# Patient Record
Sex: Female | Born: 1950
Health system: Southern US, Community
[De-identification: ages and names within clinical notes are randomized; demographics above are authoritative.]

## PROBLEM LIST (undated history)

## (undated) DIAGNOSIS — E785 Hyperlipidemia, unspecified: Secondary | ICD-10-CM

## (undated) DIAGNOSIS — C801 Malignant (primary) neoplasm, unspecified: Secondary | ICD-10-CM

## (undated) DIAGNOSIS — J039 Acute tonsillitis, unspecified: Secondary | ICD-10-CM

## (undated) DIAGNOSIS — I8390 Asymptomatic varicose veins of unspecified lower extremity: Secondary | ICD-10-CM

## (undated) DIAGNOSIS — I10 Essential (primary) hypertension: Secondary | ICD-10-CM

## (undated) HISTORY — DX: Essential (primary) hypertension: I10

## (undated) HISTORY — DX: Asymptomatic varicose veins of unspecified lower extremity: I83.90

## (undated) HISTORY — PX: LAPAROSCOPY: SHX197

## (undated) HISTORY — DX: Acute tonsillitis, unspecified: J03.90

## (undated) HISTORY — DX: Hyperlipidemia, unspecified: E78.5

## (undated) HISTORY — PX: CERVICAL CONIZATION W/BX: SHX1330

## (undated) HISTORY — PX: ABDOMINAL HYSTERECTOMY: SHX81

---

## 1998-12-12 ENCOUNTER — Other Ambulatory Visit: Admission: RE | Admit: 1998-12-12 | Discharge: 1998-12-12 | Payer: Self-pay | Admitting: Obstetrics and Gynecology

## 1999-02-08 ENCOUNTER — Encounter (INDEPENDENT_AMBULATORY_CARE_PROVIDER_SITE_OTHER): Payer: Self-pay | Admitting: Specialist

## 1999-02-08 ENCOUNTER — Inpatient Hospital Stay (HOSPITAL_COMMUNITY): Admission: RE | Admit: 1999-02-08 | Discharge: 1999-02-10 | Payer: Self-pay | Admitting: Obstetrics and Gynecology

## 2000-03-27 ENCOUNTER — Other Ambulatory Visit: Admission: RE | Admit: 2000-03-27 | Discharge: 2000-03-27 | Payer: Self-pay | Admitting: Obstetrics and Gynecology

## 2001-05-05 ENCOUNTER — Other Ambulatory Visit: Admission: RE | Admit: 2001-05-05 | Discharge: 2001-05-05 | Payer: Self-pay | Admitting: Obstetrics and Gynecology

## 2004-12-08 ENCOUNTER — Ambulatory Visit: Payer: Self-pay | Admitting: Internal Medicine

## 2004-12-12 ENCOUNTER — Ambulatory Visit: Payer: Self-pay | Admitting: Internal Medicine

## 2006-03-27 ENCOUNTER — Ambulatory Visit: Payer: Self-pay | Admitting: Internal Medicine

## 2006-03-27 LAB — CONVERTED CEMR LAB
ALT: 15 units/L (ref 0–40)
Bilirubin Urine: NEGATIVE
CO2: 34 meq/L — ABNORMAL HIGH (ref 19–32)
Chloride: 97 meq/L (ref 96–112)
Chol/HDL Ratio, serum: 3
Creatinine, Ser: 0.8 mg/dL (ref 0.4–1.2)
GFR calc non Af Amer: 79 mL/min
Glomerular Filtration Rate, Af Am: 95 mL/min/{1.73_m2}
Glucose, Bld: 97 mg/dL (ref 70–99)
HDL: 79.6 mg/dL (ref >39.0)
Ketones, ur: NEGATIVE mg/dL
Leukocytes, UA: NEGATIVE
Lymphocytes Relative: 35.1 % (ref 12.0–46.0)
MCHC: 33.7 g/dL (ref 30.0–36.0)
MCV: 97.6 fL (ref 78.0–100.0)
Monocytes Absolute: 0.5 10*3/uL (ref 0.2–0.7)
Monocytes Relative: 7.9 % (ref 3.0–11.0)
Neutro Abs: 3.7 10*3/uL (ref 1.4–7.7)
Platelets: 240 10*3/uL (ref 150–400)
RBC: 3.98 M/uL (ref 3.87–5.11)
Sodium: 138 meq/L (ref 135–145)
Specific Gravity, Urine: 1.015 (ref 1.000–1.03)
TSH: 0.66 microintl units/mL (ref 0.35–5.50)
Total Protein, Urine: NEGATIVE mg/dL
VLDL: 16 mg/dL (ref 0–40)
pH: 7 (ref 5.0–8.0)

## 2006-03-28 ENCOUNTER — Ambulatory Visit: Payer: Self-pay | Admitting: Internal Medicine

## 2006-05-08 ENCOUNTER — Ambulatory Visit (HOSPITAL_BASED_OUTPATIENT_CLINIC_OR_DEPARTMENT_OTHER): Admission: RE | Admit: 2006-05-08 | Discharge: 2006-05-08 | Payer: Self-pay | Admitting: Surgery

## 2006-05-08 ENCOUNTER — Encounter (INDEPENDENT_AMBULATORY_CARE_PROVIDER_SITE_OTHER): Payer: Self-pay | Admitting: Specialist

## 2007-05-02 ENCOUNTER — Telehealth: Payer: Self-pay | Admitting: Internal Medicine

## 2007-05-14 ENCOUNTER — Telehealth: Payer: Self-pay | Admitting: Internal Medicine

## 2007-05-14 ENCOUNTER — Ambulatory Visit: Payer: Self-pay | Admitting: Internal Medicine

## 2007-05-14 ENCOUNTER — Encounter: Payer: Self-pay | Admitting: *Deleted

## 2007-05-14 DIAGNOSIS — I868 Varicose veins of other specified sites: Secondary | ICD-10-CM | POA: Insufficient documentation

## 2007-05-14 DIAGNOSIS — E785 Hyperlipidemia, unspecified: Secondary | ICD-10-CM | POA: Insufficient documentation

## 2007-05-14 DIAGNOSIS — I10 Essential (primary) hypertension: Secondary | ICD-10-CM | POA: Insufficient documentation

## 2007-05-14 LAB — CONVERTED CEMR LAB
ALT: 13 units/L (ref 0–35)
AST: 18 units/L (ref 0–37)
Alkaline Phosphatase: 78 units/L (ref 39–117)
BUN: 14 mg/dL (ref 6–23)
Basophils Relative: 0.3 % (ref 0.0–1.0)
Bilirubin Urine: NEGATIVE
Bilirubin, Direct: 0.2 mg/dL (ref 0.0–0.3)
CO2: 33 meq/L — ABNORMAL HIGH (ref 19–32)
Calcium: 10 mg/dL (ref 8.4–10.5)
Creatinine, Ser: 0.8 mg/dL (ref 0.4–1.2)
Eosinophils Relative: 0.4 % (ref 0.0–5.0)
Glucose, Bld: 104 mg/dL — ABNORMAL HIGH (ref 70–99)
Hemoglobin: 14.1 g/dL (ref 12.0–15.0)
Leukocytes, UA: NEGATIVE
Lymphocytes Relative: 45.8 % (ref 12.0–46.0)
Monocytes Relative: 7.3 % (ref 3.0–11.0)
Neutro Abs: 3.6 10*3/uL (ref 1.4–7.7)
Nitrite: NEGATIVE
Platelets: 269 10*3/uL (ref 150–400)
RDW: 12.1 % (ref 11.5–14.6)
Specific Gravity, Urine: 1.015 (ref 1.000–1.03)
Total Bilirubin: 0.8 mg/dL (ref 0.3–1.2)
Total Protein, Urine: NEGATIVE mg/dL
Total Protein: 6.8 g/dL (ref 6.0–8.3)
Urine Glucose: NEGATIVE mg/dL
VLDL: 14 mg/dL (ref 0–40)
WBC: 7.7 10*3/uL (ref 4.5–10.5)
pH: 5.5 (ref 5.0–8.0)

## 2007-05-15 ENCOUNTER — Ambulatory Visit: Payer: Self-pay | Admitting: Internal Medicine

## 2007-05-16 ENCOUNTER — Telehealth: Payer: Self-pay | Admitting: Internal Medicine

## 2007-05-16 ENCOUNTER — Encounter: Payer: Self-pay | Admitting: Internal Medicine

## 2008-03-26 ENCOUNTER — Encounter: Payer: Self-pay | Admitting: Internal Medicine

## 2008-06-01 ENCOUNTER — Telehealth: Payer: Self-pay | Admitting: Internal Medicine

## 2008-06-15 ENCOUNTER — Ambulatory Visit: Payer: Self-pay | Admitting: Internal Medicine

## 2008-06-15 LAB — CONVERTED CEMR LAB
ALT: 15 units/L (ref 0–35)
AST: 18 units/L (ref 0–37)
Basophils Absolute: 0 10*3/uL (ref 0.0–0.1)
Basophils Relative: 0.4 % (ref 0.0–3.0)
Bilirubin, Direct: 0.1 mg/dL (ref 0.0–0.3)
CO2: 33 meq/L — ABNORMAL HIGH (ref 19–32)
Chloride: 99 meq/L (ref 96–112)
Glucose, Bld: 105 mg/dL — ABNORMAL HIGH (ref 70–99)
Ketones, ur: 15 mg/dL — AB
Lymphocytes Relative: 44.6 % (ref 12.0–46.0)
MCHC: 34.4 g/dL (ref 30.0–36.0)
Monocytes Relative: 6.4 % (ref 3.0–12.0)
Neutrophils Relative %: 48.1 % (ref 43.0–77.0)
Nitrite: NEGATIVE
RBC: 4.28 M/uL (ref 3.87–5.11)
Sodium: 140 meq/L (ref 135–145)
Specific Gravity, Urine: 1.025 (ref 1.000–1.03)
Total Bilirubin: 1 mg/dL (ref 0.3–1.2)
Total CHOL/HDL Ratio: 2.8
Urobilinogen, UA: 0.2 (ref 0.0–1.0)
VLDL: 13 mg/dL (ref 0–40)
pH: 5.5 (ref 5.0–8.0)

## 2008-06-17 ENCOUNTER — Ambulatory Visit: Payer: Self-pay | Admitting: Internal Medicine

## 2009-07-22 ENCOUNTER — Encounter: Payer: Self-pay | Admitting: Internal Medicine

## 2009-07-28 ENCOUNTER — Encounter: Payer: Self-pay | Admitting: Internal Medicine

## 2009-08-25 ENCOUNTER — Telehealth: Payer: Self-pay | Admitting: Internal Medicine

## 2009-09-14 ENCOUNTER — Ambulatory Visit: Payer: Self-pay | Admitting: Internal Medicine

## 2009-09-14 LAB — CONVERTED CEMR LAB
BUN: 9 mg/dL (ref 6–23)
CO2: 31 meq/L (ref 19–32)
Calcium: 9.7 mg/dL (ref 8.4–10.5)
Chloride: 100 meq/L (ref 96–112)
Creatinine, Ser: 0.8 mg/dL (ref 0.4–1.2)
GFR calc non Af Amer: 77.45 mL/min (ref >60)
Glucose, Bld: 94 mg/dL (ref 70–99)

## 2009-09-15 ENCOUNTER — Ambulatory Visit: Payer: Self-pay | Admitting: Internal Medicine

## 2010-01-17 ENCOUNTER — Encounter: Payer: Self-pay | Admitting: Internal Medicine

## 2010-04-13 ENCOUNTER — Encounter: Payer: Self-pay | Admitting: Internal Medicine

## 2010-07-04 NOTE — Assessment & Plan Note (Signed)
Summary: FU ON BP/ NWS  #   Vital Signs:  Patient profile:   60 year old female Height:      66 inches Weight:      158 pounds BMI:     25.59 O2 Sat:      97 % on Room air Temp:     98.2 degrees F oral Pulse rate:   56 / minute BP sitting:   148 / 76  (left arm) Cuff size:   regular  Vitals Entered By: Bill Salinas CMA (September 15, 2009 1:15 PM)  O2 Flow:  Room air CC: pt here for yearly office visit for follow up on BP, pt declined flu shot and has never had shingles vaccine or colonoscopy, she has had a mammogram in the past year and sees a obgyn for pap smears/ ab   Primary Care Provider:  Offie Waide  CC:  pt here for yearly office visit for follow up on BP, pt declined flu shot and has never had shingles vaccine or colonoscopy, and she has had a mammogram in the past year and sees a obgyn for pap smears/ ab.  History of Present Illness: Patient presents for routine medical follow-up. SHe has been well in the interval since her last visit. She has an appontment with her Gyn. She has had her mammogram. She has no specific medical complaints at this time.   Current Medications (verified): 1)  Lisinopril 20 Mg Tabs (Lisinopril) .Marland Kitchen.. 1 Once Daily 2)  Diltiazem Hcl Er Beads 240 Mg Xr24h-Cap (Diltiazem Hcl Er Beads) .Marland Kitchen.. 1 By Mouth Once Daily 3)  Hydrochlorothiazide 25 Mg  Tabs (Hydrochlorothiazide) .... Take 1 Tab By Mouth Once Daily 4)  Multivitamins   Tabs (Multiple Vitamin) .... Take One Tablet Once Daily 5)  Klor-Con 10 10 Meq Cr-Tabs (Potassium Chloride) .Marland Kitchen.. 1 By Mouth Once Daily  Allergies (verified): No Known Drug Allergies  Past History:  Past Medical History: Last updated: 05/14/2007 VARICOSE VEIN (ICD-456.8) HYPERLIPIDEMIA, BORDERLINE (ICD-272.4) HYPERTENSION (ICD-401.9) ROUTINE GENERAL MEDICAL EXAM@HEALTH  CARE FACL (ICD-V70.0)    Past Surgical History: Last updated: 05/15/2007 Hysterectomy-'00 fibroids. laparoscopy 1980/11/04- fertility work-up conization 11-05-1975 after  abnl PAP P2G2  Family History: Last updated: 05/15/2007 mother - [birth year] 05[birth year] 1930, HTN, PUD father- died 69, HTN, ulcers, oral cancer 2 brother-healthy 1 sister - healthy  Social History: Last updated: 05/15/2007 [employer] in [place] Married 1966/11/05 - divorced 11/05/1990; 1 son 67; 1 daughter died-MVA 9th grade Monogamous relationship for 10 years. 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  Risk Factors: Alcohol Use: 0 (05/15/2007) Exercise: yes (05/15/2007)  Risk Factors: Smoking Status: never (05/15/2007)  Review of Systems  The patient denies anorexia, fever, weight loss, weight gain, vision loss, decreased hearing, hoarseness, chest pain, dyspnea on exertion, prolonged cough, headaches, abdominal pain, hematochezia, hematuria, incontinence, suspicious skin lesions, transient blindness, depression, abnormal bleeding, and angioedema.    Physical Exam  General:  Well-developed,well-nourished,in no acute distress; alert,appropriate and cooperative throughout examination Head:  Normocephalic and atraumatic without obvious abnormalities. No apparent alopecia or balding. Eyes:  No corneal or conjunctival inflammation noted. EOMI. Perrla. Funduscopic exam benign, without hemorrhages, exudates or papilledema. Vision grossly normal. Ears:  External ear exam shows no significant lesions or deformities.  Otoscopic examination reveals clear canals, tympanic membranes are intact bilaterally without bulging, retraction, inflammation or discharge. Hearing is grossly normal bilaterally. Nose:  External nasal examination shows no deformity or inflammation. Nasal mucosa are pink and moist without lesions or exudates. Mouth:  Oral mucosa and  oropharynx without lesions or exudates.  Teeth in good repair. Neck:  No deformities, masses, or tenderness noted. Chest Wall:  No deformities, masses, or tenderness noted. Lungs:  Normal respiratory effort, chest expands symmetrically. Lungs are clear to  auscultation, no crackles or wheezes. Heart:  Normal rate and regular rhythm. S1 and S2 normal without gallop, murmur, click, rub or other extra sounds. Abdomen:  soft, non-tender, normal bowel sounds, no masses, no guarding, and no hepatomegaly.   Msk:  normal ROM, no joint tenderness, no joint swelling, no redness over joints, and no joint deformities.   Pulses:  normal radial and DP pulses Extremities:  No clubbing, cyanosis, edema, or deformity noted with normal full range of motion of all joints.   Neurologic:  No cranial nerve deficits noted. Station and gait are normal. Plantar reflexes are down-going bilaterally. DTRs are symmetrical throughout. Sensory, motor and coordinative functions appear intact. Skin:  turgor normal, color normal, no rashes, no purpura, and no ulcerations.   Cervical Nodes:  no anterior cervical adenopathy and no posterior cervical adenopathy.   Psych:  Oriented X3 and memory intact for recent and remote.     Impression & Recommendations:  Problem # 1:  HYPERLIPIDEMIA, BORDERLINE (ICD-272.4) Last lipid panel Jan '10 with LDL 143.5. Patient continues to opt for life-style management. Performed [facility] cardiac risk calculation - giving a 10 year risk of 3% for cardiac event using available data. Hypothetical calculation with Total cholesterol of 170 ( assuming lower value based on LDL at or below goal of 130) reduced risk to 2%.  Plan - patient accepts the level of risk above and will continue with life-style management  Problem # 2:  HYPERTENSION (ICD-401.9)  Her updated medication list for this problem includes:    Lisinopril 20 Mg Tabs (Lisinopril) .Marland Kitchen... 1 once daily    Diltiazem Hcl Er Beads 240 Mg Xr24h-cap (Diltiazem hcl er beads) .Marland Kitchen... 1 by mouth once daily    Hydrochlorothiazide 25 Mg Tabs (Hydrochlorothiazide) .Marland Kitchen... Take 1 tab by mouth once daily  BP today: 148/76 Prior BP: 148/70 (06/17/2008)  Patient reports lower values when not at MD  office. Will continue present medications. She will need to record home values and report back.  Problem # 3:  Preventive Health Care (ICD-V70.0) Unremarkable history. Normal physical exam excluidng Gyn. She has appointment with gyn scheduled. Reviewed initial and follow-up mammograms: she will need next follow-up in August '11 tracking abnormal cystic structure. She is due for colonoscopy and will check her insurance coverage and get back to me for scheduling if covered. Her lab results were fine. Immunization for tetnus and pneumo brought up to date. She will check on coverage for Zostavax.  In summary - a delightful woman who appears to be medically stable. She will return as needed or 1 year.   Complete Medication List: 1)  Lisinopril 20 Mg Tabs (Lisinopril) .Marland Kitchen.. 1 once daily 2)  Diltiazem Hcl Er Beads 240 Mg Xr24h-cap (Diltiazem hcl er beads) .Marland Kitchen.. 1 by mouth once daily 3)  Hydrochlorothiazide 25 Mg Tabs (Hydrochlorothiazide) .... Take 1 tab by mouth once daily 4)  Multivitamins Tabs (Multiple vitamin) .... Take one tablet once daily 5)  Klor-con 10 10 Meq Cr-tabs (Potassium chloride) .Marland Kitchen.. 1 by mouth once daily  Other Orders: Tdap => 48yrs IM (16109) Pneumococcal Vaccine (60454) Admin 1st Vaccine (09811) Admin of Any Addtl Vaccine (91478)   Patient: Jillian Hartman Note: All result statuses are Final unless otherwise noted.  Tests: (1) CMP (COMP)  Sodium                    141 mEq/L                   135-145   Potassium            [L]  3.1 mEq/L                   3.5-5.1   Chloride                  100 mEq/L                   96-112   Carbon Dioxide            31 mEq/L                    19-32   Glucose                   94 mg/dL                    16-10   BUN                       9 mg/dL                     9-60   Creatinine                0.8 mg/dL                   4.5-4.0   Total Bilirubin           0.7 mg/dL                   9.8-1.1   Alkaline Phosphatase      84 U/L                       39-117   AST                       18 U/L                      0-37   ALT                       14 U/L                      0-35   Total Protein             6.9 g/dL                    9.1-4.7   Albumin                   4.0 g/dL                    8.2-9.5   Calcium                   9.7 mg/dL                   6.2-13.0   GFR  77.45 mL/min                >60  Immunizations Administered:  Tetanus Vaccine:    Vaccine Type: Tdap    Site: left deltoid    Mfr: GlaxoSmithKline    Dose: 0.5 ml    Route: IM    Given by: Ami Bullins CMA    Exp. Date: 08/27/2011    Lot #: [id number]    VIS given: 04/22/07 version given September 15, 2009.  Pneumonia Vaccine:    Vaccine Type: Pneumovax    Site: right deltoid    Mfr: Merck    Dose: 0.5 ml    Route: IM    Given by: Ami Bullins CMA    Exp. Date: 01/16/2011    Lot #: [id number]    VIS given: 12/31/95 version given September 15, 2009.

## 2010-07-04 NOTE — Progress Notes (Signed)
Summary: LABS  Phone Note Call from Patient Call back at Home Phone [phone] Call back at Work Phone [phone]   Summary of Call: Pt is scheduled for f/u on BP 4/14. She is requesting labs prior.  Initial call taken by: Lamar Sprinkles, CMA,  August 25, 2009 5:33 PM  Follow-up for Phone Call        OK for metabolic panel  401.9 Follow-up by: Jacques Navy MD,  August 26, 2009 10:13 AM  Additional Follow-up for Phone Call Additional follow up Details #1::        tried to call patient, mailbox is full Additional Follow-up by: Lucious Groves,  August 26, 2009 10:18 AM    Additional Follow-up for Phone Call Additional follow up Details #2::    rtn call to patient, left message on machine to call back to office. Follow-up by: Lucious Groves,  August 26, 2009 10:31 AM  Additional Follow-up for Phone Call Additional follow up Details #3:: Details for Additional Follow-up Action Taken: patient notified. Additional Follow-up by: Lucious Groves,  August 26, 2009 10:42 AM

## 2010-10-20 NOTE — Assessment & Plan Note (Signed)
[facility]                             PRIMARY CARE OFFICE NOTE   PRANATHI, WINFREE                   MRN:          [id number]  DATE:03/28/2006                            DOB:          Oct 22, 1950    Ms. Stang is a 60 year old Caucasian woman, well known to me and followed  for general health care. She was last seen on, December 12, 2004.   INTERVAL HISTORY:  Pain therapy.  Patient has had five series of injections  for varicose veins under the direction of Dr. Molli Hazard. She reports  she had one minor injection site infection which cleared easily. The patient  reports she otherwise has had a healthy year.   PAST MEDICAL HISTORY:  Is well documented in my chart note of December 12, 2004.   FAMILY HISTORY:  Previously documented.   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  However, as of August 2007, the patient has changed affiliations to  [facility]. This has been a good change for her although the market is  very soft. It is much closer to her home. Her commute is much shorter and  she is very confident for the future. She reports that her home situation is  very stable.   CURRENT MEDICATIONS:  1. Lisinopril 20 mg daily.  2. Diltiazem 240 mg daily.  3. Hydrochlorothiazide 25 mg daily.  4. Estradiol 1 mg daily.  5. Multivitamin and calcium.   HEALTH MAINTENANCE:  The patient's last mammogram was in June 2006 and she  is schedule for a mammogram later this month. The patient is going to  establish with [facility], and has an appointment with a nurse  practitioner for pelvic and pap smear in November.   REVIEW OF SYSTEMS:  The patient has had no constitutional problems. Last  opthalmic exam was in May 2007 and was normal. No ENT, cardiovascular,  respiratory, GI, GU or musculoskeletal problems.   PHYSICAL EXAMINATION:  VITAL SIGNS: Temperature 97.7, blood pressure 143/83,  pulse 84, weight  148.  GENERAL: This is a well nourished, well groomed woman, looking younger than  her stated chronologic age, in no acute distress.  HEENT: Normocephalic, atraumatic. EACs and TMs were unremarkable. Oropharynx  with native dentition in good repair. No buccal or palate lesions were  noted. Posterior pharynx was clear. Conjunctivae and sclerae were clear.  PERRLA, EOMI.  Funduscopic exam deferred to ophthalmology.  NECK: Supple without thyromegaly, no __________ was noted in the cervical or  supraclavicular regions.  CHEST: No CVA tenderness.  LUNGS: Clear to auscultation and percussion.  CARDIOVASCULAR: Two plus radial pulse, no JVD or carotid bruits. She had a  quiet precordium with a regular rate and rhythm, without murmurs, rubs or  gallops.  BREASTS: Deferred to gynecology.  ABDOMEN: Soft, no guarding or rebound, no organomegaly or splenomegaly was  noted.  PELVIC: Deferred to gynecology.  RECTAL: Deferred.  EXTREMITIES: Without clubbing, cyanosis, edema or deformity.  NEUROLOGIC: Non-focal.  SKIN: Clear.   DATABASE:  Hemoglobin 13.1 grams, white count was normal at 6,400 with a  normal differential. Cholesterol 238. Triglycerides 80. HDL 79.6. LDL 141.2.  Chemistries revealed a slightly low potassium at 3.1. Sugar was normal at  97. Liver function, kidney functions were normal. Thyroid function normal.  TSH at 0.66. Urinalysis was negative.   ASSESSMENT AND PLAN:  1. Hypertension.  The patient's blood pressure was mildly elevated in the      office. She reports her blood pressures at home are consistently in the      130s over 80s.  Plan:  Patient to continue on her present medication.  2. Lipids.  The patient's LDL cholesterol is above goal, which is 130 or      less. Interviewing the patient's chart, her LDL was 155 last year and      she has been as low as 113. Discuss risk/benefit with the patient,      including NCEP guidelines and recommendations. At this point, the       patient prefers lifestyle management, given her success in the past.      Plan:  The patient is to follow a low-fat, healthy diet. She should      have a repeat lipid profile in either six months or in one year at her      convenience.  3. Health maintenance.  The patient is current on her physical      examination. She does need to schedule a colonoscopy, and she will do      this at her convenience. The patient is scheduled for a gynecological      exam. She was provided with a prescription estradiol at this visit. The      patient was asked to have a copy of her mammogram forwarded to me for      our records.   SUMMARIES:  A very pleasant woman who seems medically stable with well  controlled blood pressure and an effort with lifestyle management for lipid  control.    ______________________________  Rosalyn Gess. Norins, MD    MEN/MedQ  DD: 03/28/2006  DT: 03/30/2006  Job #: [id number]   cc:   Andrew Au

## 2010-10-21 ENCOUNTER — Other Ambulatory Visit: Payer: Self-pay | Admitting: Internal Medicine

## 2010-12-23 ENCOUNTER — Other Ambulatory Visit: Payer: Self-pay | Admitting: Internal Medicine

## 2011-03-01 ENCOUNTER — Other Ambulatory Visit: Payer: Self-pay | Admitting: Internal Medicine

## 2011-04-05 ENCOUNTER — Other Ambulatory Visit: Payer: Self-pay | Admitting: *Deleted

## 2011-04-05 ENCOUNTER — Encounter: Payer: Self-pay | Admitting: Internal Medicine

## 2011-04-05 MED ORDER — POTASSIUM CHLORIDE 10 MEQ PO TBCR: 10.0000 meq | EXTENDED_RELEASE_TABLET | Freq: Every day | ORAL | Status: DC

## 2011-04-05 MED ORDER — DILTIAZEM HCL ER COATED BEADS 240 MG PO CP24: 240.0000 mg | ORAL_CAPSULE | Freq: Every day | ORAL | Status: DC

## 2011-04-05 MED ORDER — LISINOPRIL 20 MG PO TABS: 20.0000 mg | ORAL_TABLET | Freq: Every day | ORAL | Status: DC

## 2011-04-05 MED ORDER — HYDROCHLOROTHIAZIDE 25 MG PO TABS: 25.0000 mg | ORAL_TABLET | Freq: Every day | ORAL | Status: DC

## 2011-04-12 ENCOUNTER — Ambulatory Visit (INDEPENDENT_AMBULATORY_CARE_PROVIDER_SITE_OTHER): Payer: BC Managed Care – PPO | Admitting: Internal Medicine

## 2011-04-12 ENCOUNTER — Other Ambulatory Visit (INDEPENDENT_AMBULATORY_CARE_PROVIDER_SITE_OTHER): Payer: BC Managed Care – PPO

## 2011-04-12 ENCOUNTER — Encounter: Payer: Self-pay | Admitting: Internal Medicine

## 2011-04-12 VITALS — BP 154/80 | HR 92 | Temp 98.1°F | Ht 64.0 in | Wt 162.0 lb

## 2011-04-12 DIAGNOSIS — E785 Hyperlipidemia, unspecified: Secondary | ICD-10-CM

## 2011-04-12 DIAGNOSIS — I10 Essential (primary) hypertension: Secondary | ICD-10-CM

## 2011-04-12 DIAGNOSIS — Z0000 Encounter for general adult medical examination without abnormal findings: Secondary | ICD-10-CM

## 2011-04-12 LAB — COMPREHENSIVE METABOLIC PANEL
ALT: 13 U/L (ref 0–35)
AST: 20 U/L (ref 0–37)
Albumin: 4.1 g/dL (ref 3.5–5.2)
BUN: 15 mg/dL (ref 6–23)
Calcium: 9.9 mg/dL (ref 8.4–10.5)
Chloride: 100 mEq/L (ref 96–112)
Potassium: 3 mEq/L — ABNORMAL LOW (ref 3.5–5.1)
Sodium: 141 mEq/L (ref 135–145)
Total Protein: 7.3 g/dL (ref 6.0–8.3)

## 2011-04-12 NOTE — Patient Instructions (Addendum)
Blood pressure is a little high - above goal of of 130+/80's. Plan - continue your present medications. Will check routine labs.  Sinus pressure - no sign of infection. Pain is from sinus congestion - most likely allergic in origin. Plan - nasal saline wash several times a day; sudafed (generic is fine) 30 mg two or three times a day.  Health maintenance: Mammogram every 2 years; no longer a need for PAP smears; need colonoscopy; immunizations need shingles vaccine.  Be well - come back to see me as needed.

## 2011-04-12 NOTE — Progress Notes (Signed)
  Subjective:    Patient ID: Jillian Hartman, female    DOB: 08-30-1950, 60 y.o.   MRN: [id number]  HPI Jillian Hartman presents for medical follow-up. Last gyn exam in '10, she is scheduled for mammogram, she is current with her dentis and she has an appointment with her eye doctor.   Past Medical History  Diagnosis Date  . Varicose vein   . Borderline hyperlipidemia   . Hypertension    Past Surgical History  Procedure Date  . Abdominal hysterectomy   . Laparoscopy     '82 fertility work up  . Cervical conization w/bx     '77 after abnormal pap   Family History  Problem Relation Age of Onset  . Hypertension Mother   . Hypertension Father   . Ulcers Father   . Cancer Father     oral cancer   History   Social History  . Marital Status: Divorced    Spouse Name: N/A    Number of Children: 2  . Years of Education: 16   Occupational History  . real estate sales    Social History Main Topics  . Smoking status: Not on file  . Smokeless tobacco: Not on file  . Alcohol Use: Not on file  . Drug Use: Not on file  . Sexually Active: Not on file   Other Topics Concern  . 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 Married 70- divorced [phone] son 42- 1 daughter died in MVA 9th grade. Monogamous relationship since 1998. 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       Review of Systems Constitutional:  Negative for fever, chills, activity change and unexpected weight change.  HEENT:  Negative for hearing loss, ear pain, congestion, neck stiffness and postnasal drip. Negative for sore throat or swallowing problems. Negative for dental complaints.   Eyes: Negative for vision loss or change in visual acuity.  Respiratory: Negative for chest tightness and wheezing. Negative for DOE.   Cardiovascular: Negative for chest pain or palpitations. No decreased exercise tolerance Gastrointestinal: No change in bowel habit. No bloating or gas. No reflux  or indigestion Genitourinary: Negative for urgency, frequency, flank pain and difficulty urinating.  Musculoskeletal: Negative for myalgias, back pain, arthralgias and gait problem.  Neurological: Negative for dizziness, tremors, weakness and headaches.  Hematological: Negative for adenopathy.  Psychiatric/Behavioral: Negative for behavioral problems and dysphoric mood.       Objective:   Physical Exam Vitals - routine Gen'l - wnwd white woman in no distress HEENT- no tenderness over the frontal or maxillary sinus, TMs normal, C&S clear Chest- CTAP Cor - RRR, 2_+ radial and DP pulses Abd - BS+, Ext - no deformity  Lab Results  Component Value Date   WBC 7.1 06/15/2008   HGB 14.1 06/15/2008   HCT 41.1 06/15/2008   PLT 249 06/15/2008   GLUCOSE 99 04/12/2011   CHOL 238* 06/15/2008   TRIG 63 06/15/2008   HDL 84.7 06/15/2008   LDLDIRECT 143.5 06/15/2008   ALT 13 04/12/2011   AST 20 04/12/2011   NA 141 04/12/2011   K 3.0* 04/12/2011   CL 100 04/12/2011   CREATININE 0.7 04/12/2011   BUN 15 04/12/2011   CO2 33* 04/12/2011   TSH 0.96 06/15/2008          Assessment & Plan:

## 2011-04-13 ENCOUNTER — Telehealth: Payer: Self-pay | Admitting: *Deleted

## 2011-04-13 DIAGNOSIS — Z9189 Other specified personal risk factors, not elsewhere classified: Secondary | ICD-10-CM

## 2011-04-13 NOTE — Telephone Encounter (Signed)
Pt states that [facility] said that she can have the Bone Density test done w/mammogram-need an order faxed to [facility] per patient request.

## 2011-04-13 NOTE — Telephone Encounter (Signed)
Order entered - ready to print and mail/fax

## 2011-04-15 DIAGNOSIS — Z0000 Encounter for general adult medical examination without abnormal findings: Secondary | ICD-10-CM | POA: Insufficient documentation

## 2011-04-15 NOTE — Assessment & Plan Note (Signed)
Last LDL in '09 143. Patient has not changed in weight or diet. She does not wish to take medication. Discussed NCEP ATP III guidelines: goal LDL = 130 or less, treatment threshold >160 despite diet and exercise. Cardiac risk calculation <10% over 10 years.  Plan - no indication to repeat lab given her decision to not take medication.           Life-style mgt: low fat, calorie restricted diet and regular exercise.

## 2011-04-15 NOTE — Assessment & Plan Note (Signed)
BP Readings from Last 3 Encounters:  04/12/11 154/80  09/15/09 148/76  06/17/08 148/70   Borderline control.   Plan - home monitoring with report back: will need ROV if SBP consistently >140 at home.           Continue triple therapy: CCB, ACE-I, Diuretic           Increase potassium in diet: citrus, leafy greens.

## 2011-04-15 NOTE — Assessment & Plan Note (Signed)
Interval medical history is benign. Physical exam, sans pelvic and breast, notable for weight. Metabolic panel reveals normal kidney function and blood sugar, slightly low postassium. Coloncancer screening - no colonoscopy in EMR: if not done it is highly recommended to have colonoscopy. She is current with mammography and with her gynecologist. Immunizations: Tetanus April '11. She decline influenza and pneumonia vaccines.    In summary - a very nice woman who appears medically stable. She is counseled to continue her weight management efforts; to consider and schedule colonoscopy. She will return in 6 months for brief follow-up of blood pressure.

## 2011-04-16 NOTE — Telephone Encounter (Signed)
Bone Density order faxed to [facility] at [phone].

## 2011-04-25 ENCOUNTER — Encounter: Payer: Self-pay | Admitting: Internal Medicine

## 2011-04-30 ENCOUNTER — Telehealth: Payer: Self-pay | Admitting: *Deleted

## 2011-04-30 NOTE — Telephone Encounter (Signed)
Pt feels she may have a virus-sick for 8 days: started w/sore throat, cough, chest congestion; now all head congestion w/watery eyes [like allergy Pt states]. Pt states she is taking OTC decongestant and would like to know if you believe she needs a stronger medication.

## 2011-04-30 NOTE — Telephone Encounter (Signed)
LMOM to inform patient. 

## 2011-04-30 NOTE — Telephone Encounter (Signed)
Supportive care: sudafed is ok up to three times a day. Robvitussin DM is good every 4 hours

## 2011-05-03 ENCOUNTER — Encounter: Payer: Self-pay | Admitting: Internal Medicine

## 2011-05-07 ENCOUNTER — Other Ambulatory Visit: Payer: Self-pay | Admitting: *Deleted

## 2011-05-07 MED ORDER — LISINOPRIL 20 MG PO TABS: 20.0000 mg | ORAL_TABLET | Freq: Every day | ORAL | Status: DC

## 2011-05-07 MED ORDER — POTASSIUM CHLORIDE 10 MEQ PO TBCR: 10.0000 meq | EXTENDED_RELEASE_TABLET | Freq: Every day | ORAL | Status: DC

## 2011-05-07 NOTE — Progress Notes (Signed)
Patient states she recvd [2] of the [4] Rxs sent to pharmacy on 11.01.12, but that the pharmacy states they did not receive the Lisinopril & Klor-Con. Phoned pharmacy and gave VO to fill prescriptions. Informed patient.

## 2011-07-31 ENCOUNTER — Other Ambulatory Visit: Payer: Self-pay | Admitting: Internal Medicine

## 2011-07-31 NOTE — Telephone Encounter (Signed)
Done

## 2011-10-25 ENCOUNTER — Encounter: Payer: Self-pay | Admitting: Endocrinology

## 2011-10-25 ENCOUNTER — Telehealth: Payer: Self-pay | Admitting: Internal Medicine

## 2011-10-25 ENCOUNTER — Ambulatory Visit (INDEPENDENT_AMBULATORY_CARE_PROVIDER_SITE_OTHER): Payer: BC Managed Care – PPO | Admitting: Endocrinology

## 2011-10-25 DIAGNOSIS — R319 Hematuria, unspecified: Secondary | ICD-10-CM | POA: Insufficient documentation

## 2011-10-25 LAB — POCT URINALYSIS DIPSTICK
Ketones, UA: NEGATIVE
Leukocytes, UA: NEGATIVE
Nitrite, UA: NEGATIVE
Protein, UA: NEGATIVE
pH, UA: 5

## 2011-10-25 MED ORDER — CIPROFLOXACIN HCL 500 MG PO TABS: 500.0000 mg | ORAL_TABLET | Freq: Two times a day (BID) | ORAL | Status: AC

## 2011-10-25 NOTE — Telephone Encounter (Signed)
Caller: Jillian Hartman; PCP: Illene Regulus; CB#: [phone]; ; ; Call regarding Low Back Pain, Urinary Symptoms; notes blood on tissue.  Onset of symptoms 10/24/11 PM, but has progressed to more urinary symptoms. Has frequency, urgency, pain on voiding.  Afebrile.  Per protocol, advised being seen within 4 hours; advised [facility], as appt slots full in Epic system. TC to office to advise of disposition/send.  Dr. Everardo All will work patient in at [facility] 10/25/11.

## 2011-10-25 NOTE — Progress Notes (Signed)
  Subjective:    Patient ID: Jillian Hartman, female    DOB: 1951-02-04, 61 y.o.   MRN: [id number]  HPI Pt states 1 day of slight pain at the lower back, and assoc gross hematuria.  Last uti was many years ago.   Past Medical History  Diagnosis Date  . Varicose vein   . Borderline hyperlipidemia   . Hypertension     Past Surgical History  Procedure Date  . Abdominal hysterectomy   . Laparoscopy     '82 fertility work up  . Cervical conization w/bx     '77 after abnormal pap    History   Social History  . Marital Status: Divorced    Spouse Name: N/A    Number of Children: 2  . Years of Education: 16   Occupational History  . real estate sales    Social History Main Topics  . Smoking status: Never Smoker   . Smokeless tobacco: Not on file  . Alcohol Use: Not on file  . Drug Use: Not on file  . Sexually Active: Not on file   Other Topics Concern  . 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 Married 31- divorced [phone] son 53- 1 daughter died in MVA 9th grade. Monogamous relationship since 1998. 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    Current Outpatient Prescriptions on File Prior to Visit  Medication Sig Dispense Refill  . diltiazem (CARDIZEM CD) 240 MG 24 hr capsule Take 1 capsule (240 mg total) by mouth daily.  30 capsule  11  . hydrochlorothiazide (HYDRODIURIL) 25 MG tablet Take 1 tablet (25 mg total) by mouth daily.  30 tablet  11  . KLOR-CON 10 10 MEQ tablet TAKE 1 TABLET BY MOUTH EVERY DAY  30 tablet  10  . lisinopril (PRINIVIL,ZESTRIL) 20 MG tablet Take 1 tablet (20 mg total) by mouth daily.  30 tablet  11  . MULTIPLE VITAMIN PO Take by mouth.          No Known Allergies  Family History  Problem Relation Age of Onset  . Hypertension Mother   . Hypertension Father   . Ulcers Father   . Cancer Father     oral cancer    BP 138/82  Pulse 93  Temp(Src) 98.1 F (36.7 C) (Oral)  SpO2 95%    Review of  Systems Denies fever and dysuria.    Objective:   Physical Exam VITAL SIGNS:  See vs page GENERAL: no distress.   ABDOMEN: no suprapubic tenderness.    (i reviewed ua result)    Assessment & Plan:  Hematuria, new, uncertain etiology

## 2011-10-25 NOTE — Patient Instructions (Signed)
i have sent a prescription to your pharmacy, for an antibiotic. I hope you feel better soon.  If you don't feel better by next week, please call back.   

## 2011-11-22 ENCOUNTER — Other Ambulatory Visit: Payer: Self-pay | Admitting: Dermatology

## 2011-12-29 ENCOUNTER — Ambulatory Visit (INDEPENDENT_AMBULATORY_CARE_PROVIDER_SITE_OTHER): Payer: BC Managed Care – PPO | Admitting: Family Medicine

## 2011-12-29 ENCOUNTER — Encounter: Payer: Self-pay | Admitting: Family Medicine

## 2011-12-29 VITALS — BP 134/76 | HR 96 | Temp 100.7°F | Ht 64.0 in | Wt 150.0 lb

## 2011-12-29 DIAGNOSIS — J039 Acute tonsillitis, unspecified: Secondary | ICD-10-CM

## 2011-12-29 DIAGNOSIS — I10 Essential (primary) hypertension: Secondary | ICD-10-CM

## 2011-12-29 DIAGNOSIS — J029 Acute pharyngitis, unspecified: Secondary | ICD-10-CM

## 2011-12-29 HISTORY — DX: Acute tonsillitis, unspecified: J03.90

## 2011-12-29 LAB — POCT RAPID STREP A (OFFICE): Rapid Strep A Screen: NEGATIVE

## 2011-12-29 MED ORDER — ALIGN PO CAPS: 1.0000 | ORAL_CAPSULE | Freq: Every day | ORAL | Status: AC

## 2011-12-29 MED ORDER — HYDROCODONE-ACETAMINOPHEN 5-325 MG PO TABS: 1.0000 | ORAL_TABLET | Freq: Four times a day (QID) | ORAL | Status: AC | PRN

## 2011-12-29 MED ORDER — METHYLPREDNISOLONE 4 MG PO KIT: PACK | ORAL | Status: AC

## 2011-12-29 MED ORDER — AMOXICILLIN-POT CLAVULANATE 875-125 MG PO TABS: 1.0000 | ORAL_TABLET | Freq: Two times a day (BID) | ORAL | Status: AC

## 2011-12-29 NOTE — Progress Notes (Signed)
Jillian Hartman [id number] [birth year] 12/29/2011      Progress Note-Follow Up  Subjective  Chief Complaint  Chief Complaint  Patient presents with  . Sore Throat    x 4 days     HPI  Patient is a 61 year old Caucasian female who is in today complaining of worsening sore throat. She's been traveling this last week and feeling poorly. Or the last 2 days her symptoms upon worsen she should today for evaluation. She has had congestion and headache. She has severe pain but most notably she has throat pain. She said the pain kept her most of last night. She denies dysphagia or trouble breathing or swallowing but doesn't know she's been eating poorly because it hurts to swallow. She has been trying to keep up on her fluids. She has had low-grade fevers and chills but she also struggling with malaise, myalgias, throat pain. Denies any chest pain palpitations or shortness of breath. No nausea, diarrhea, constipation and some low-grade anorexia is noted. She has used Advil when necessary for some pain relief as well some over-the-counter  Past Medical History  Diagnosis Date  . Varicose vein   . Borderline hyperlipidemia   . Hypertension   . Tonsillitis 12/29/2011    Past Surgical History  Procedure Date  . Abdominal hysterectomy   . Laparoscopy     '82 fertility work up  . Cervical conization w/bx     '77 after abnormal pap    Family History  Problem Relation Age of Onset  . Hypertension Mother   . Hypertension Father   . Ulcers Father   . Cancer Father     oral cancer    History   Social History  . Marital Status: Divorced    Spouse Name: N/A    Number of Children: 2  . Years of Education: 16   Occupational History  . real estate sales    Social History Main Topics  . Smoking status: Never Smoker   . Smokeless tobacco: Not on file  . Alcohol Use: Not on file  . Drug Use: Not on file  . Sexually Active: Not on file   Other Topics Concern  . 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 Married 83- divorced [phone] son 60- 1 daughter died in MVA 9th grade. Monogamous relationship since 1998. 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    Current Outpatient Prescriptions on File Prior to Visit  Medication Sig Dispense Refill  . diltiazem (CARDIZEM CD) 240 MG 24 hr capsule Take 1 capsule (240 mg total) by mouth daily.  30 capsule  11  . hydrochlorothiazide (HYDRODIURIL) 25 MG tablet Take 1 tablet (25 mg total) by mouth daily.  30 tablet  11  . KLOR-CON 10 10 MEQ tablet TAKE 1 TABLET BY MOUTH EVERY DAY  30 tablet  10  . lisinopril (PRINIVIL,ZESTRIL) 20 MG tablet Take 1 tablet (20 mg total) by mouth daily.  30 tablet  11  . MULTIPLE VITAMIN PO Take by mouth.          No Known Allergies  Review of Systems  Review of Systems  Constitutional: Positive for fever, chills and malaise/fatigue.  HENT: Positive for ear pain, congestion and sore throat.   Eyes: Negative for discharge.  Respiratory: Positive for cough and sputum production. Negative for shortness of breath and wheezing.   Cardiovascular: Negative for chest pain, palpitations and leg swelling.  Gastrointestinal: Negative for nausea, abdominal  pain and diarrhea.  Genitourinary: Negative for dysuria.  Musculoskeletal: Positive for myalgias. Negative for falls.  Skin: Negative for rash.  Neurological: Positive for headaches. Negative for loss of consciousness.  Endo/Heme/Allergies: Negative for polydipsia.  Psychiatric/Behavioral: Negative for depression and suicidal ideas. The patient is not nervous/anxious and does not have insomnia.     Objective  BP 134/76  Pulse 96  Temp 100.7 F (38.2 C) (Oral)  Ht 5\' 4"  (1.626 m)  Wt 150 lb (68.04 kg)  BMI 25.75 kg/m2  SpO2 96%  Physical Exam  Physical Exam  Constitutional: She is oriented to person, place, and time and well-developed, well-nourished, and in no distress. No distress.  HENT:  Head:  Normocephalic and atraumatic.  Mouth/Throat: Oropharyngeal exudate present.       Right tonsil 3-4 + erythematous with exudate. Left tonsil 1+ and erythematous.   Eyes: Conjunctivae are normal.  Neck: Neck supple. No thyromegaly present.  Cardiovascular: Normal rate, regular rhythm and normal heart sounds.   No murmur heard. Pulmonary/Chest: Effort normal and breath sounds normal. She has no wheezes.  Abdominal: She exhibits no distension and no mass.  Musculoskeletal: She exhibits no edema.  Lymphadenopathy:    She has cervical adenopathy.  Neurological: She is alert and oriented to person, place, and time.  Skin: Skin is warm and dry. No rash noted. She is not diaphoretic.  Psychiatric: Memory, affect and judgment normal.    Lab Results  Component Value Date   TSH 0.96 06/15/2008   Lab Results  Component Value Date   WBC 7.1 06/15/2008   HGB 14.1 06/15/2008   HCT 41.1 06/15/2008   MCV 96.0 06/15/2008   PLT 249 06/15/2008   Lab Results  Component Value Date   CREATININE 0.7 04/12/2011   BUN 15 04/12/2011   NA 141 04/12/2011   K 3.0* 04/12/2011   CL 100 04/12/2011   CO2 33* 04/12/2011   Lab Results  Component Value Date   ALT 13 04/12/2011   AST 20 04/12/2011   ALKPHOS 107 04/12/2011   BILITOT 0.7 04/12/2011   Lab Results  Component Value Date   CHOL 238* 06/15/2008   Lab Results  Component Value Date   HDL 84.7 06/15/2008   No results found for this basename: [facility]   Lab Results  Component Value Date   TRIG 63 06/15/2008   Lab Results  Component Value Date   CHOLHDL 2.8 CALC 06/15/2008     Assessment & Plan  Tonsillitis Very painful throat with 3-4 + tonsil on right. Started on Augmentin 875 mg bid and Medrol dosepak, given a handful of Hydrocodone to use prn for pain control. She needs to seek immediate care if she begins to have any trouble swallowing or breathing. Return for further evaluation if symptoms do not resolve  HYPERTENSION Adequately controlled  despite acute illness

## 2011-12-29 NOTE — Patient Instructions (Addendum)
Tonsillitis Tonsils are lumps of lymphoid tissues at the back of the throat. Each tonsil has 20 crevices (crypts). Tonsils help fight nose and throat infections and keep infection from spreading to other parts of the body for the first 18 months of life. Tonsillitis is an infection of the throat that causes the tonsils to become red, tender, and swollen. CAUSES Sudden and, if treated, temporary (acute) tonsillitis is usually caused by infection with streptococcal bacteria. Long lasting (chronic) tonsillitis occurs when the crypts of the tonsils become filled with pieces of food and bacteria, which makes it easy for the tonsils to become constantly infected. SYMPTOMS  Symptoms of tonsillitis include:  A sore throat.   White patches on the tonsils.   Fever.   Tiredness.  DIAGNOSIS Tonsillitis can be diagnosed through a physical exam. Diagnosis can be confirmed with the results of lab tests, including a throat culture. TREATMENT  The goals of tonsillitis treatment include the reduction of the severity and duration of symptoms, prevention of associated conditions, and prevention of disease transmission. Tonsillitis caused by bacteria can be treated with antibiotics. Usually, treatment with antibiotics is started before the cause of the tonsillitis is known. However, if it is determined that the cause is not bacterial, antibiotics will not treat the tonsillitis. If attacks of tonsillitis are severe and frequent, your caregiver may recommend surgery to remove the tonsils (tonsillectomy). HOME CARE INSTRUCTIONS   Rest as much as possible and get plenty of sleep.   Drink plenty of fluids. While the throat is very sore, eat soft foods or liquids, such as sherbet, soups, or instant breakfast drinks.   Eat frozen ice pops.   Older children and adults may gargle with a warm or cold liquid to help soothe the throat. Mix 1 teaspoon of salt in 1 cup of water.   Other family members who also develop a  sore throat or fever should have a medical exam or throat culture.   Only take over-the-counter or prescription medicines for pain, discomfort, or fever as directed by your caregiver.   If you are given antibiotics, take them as directed. Finish them even if you start to feel better.  SEEK MEDICAL CARE IF:   Your baby is older than 3 months with a rectal temperature of 100.5 F (38.1 C) or higher for more than 1 day.   Large, tender lumps develop in your neck.   A rash develops.   Green, yellow-brown, or bloody substance is coughed up.   You are unable to swallow liquids or food for 24 hours.   Your child is unable to swallow food or liquids for 12 hours.  SEEK IMMEDIATE MEDICAL CARE IF:   You develop any new symptoms such as vomiting, severe headache, stiff neck, chest pain, or trouble breathing or swallowing.   You have severe throat pain along with drooling or voice changes.   You have severe pain, unrelieved with recommended medications.   You are unable to fully open the mouth.   You develop redness, swelling, or severe pain anywhere in the neck.   You have a fever.   Your baby is older than 3 months with a rectal temperature of 102 F (38.9 C) or higher.   Your baby is 12 months old or younger with a rectal temperature of 100.4 F (38 C) or higher.  MAKE SURE YOU:   Understand these instructions.   Will watch your condition.   Will get help right away if you are not  watch your condition.   Will get help right away if you are not doing well or get worse.  Document Released: 02/28/2005 Document Revised: 05/10/2011 Document Reviewed: 07/27/2010  ExitCare Patient Information 2012 [employer].

## 2011-12-29 NOTE — Assessment & Plan Note (Addendum)
Adequately controlled despite acute illness 

## 2011-12-29 NOTE — Assessment & Plan Note (Signed)
Very painful throat with 3-4 + tonsil on right. Started on Augmentin 875 mg bid and Medrol dosepak, given a handful of Hydrocodone to use prn for pain control. She needs to seek immediate care if she begins to have any trouble swallowing or breathing. Return for further evaluation if symptoms do not resolve

## 2012-03-29 ENCOUNTER — Other Ambulatory Visit: Payer: Self-pay | Admitting: Internal Medicine

## 2012-04-29 ENCOUNTER — Other Ambulatory Visit: Payer: Self-pay | Admitting: Internal Medicine

## 2012-06-28 ENCOUNTER — Other Ambulatory Visit: Payer: Self-pay | Admitting: Internal Medicine

## 2012-07-28 ENCOUNTER — Other Ambulatory Visit: Payer: Self-pay | Admitting: Internal Medicine

## 2012-07-28 NOTE — Telephone Encounter (Signed)
Med filled.  

## 2012-08-23 ENCOUNTER — Other Ambulatory Visit: Payer: Self-pay | Admitting: Internal Medicine

## 2012-08-25 NOTE — Telephone Encounter (Signed)
Rx request to pharmacy/SLS  

## 2012-12-23 ENCOUNTER — Other Ambulatory Visit: Payer: Self-pay | Admitting: Internal Medicine

## 2013-03-31 ENCOUNTER — Other Ambulatory Visit: Payer: Self-pay | Admitting: Internal Medicine

## 2013-05-01 ENCOUNTER — Other Ambulatory Visit: Payer: Self-pay | Admitting: Internal Medicine

## 2013-08-11 ENCOUNTER — Telehealth: Payer: Self-pay | Admitting: *Deleted

## 2013-08-11 MED ORDER — LISINOPRIL 20 MG PO TABS: ORAL_TABLET | ORAL | Status: DC

## 2013-08-11 MED ORDER — HYDROCHLOROTHIAZIDE 25 MG PO TABS: ORAL_TABLET | ORAL | Status: DC

## 2013-08-11 MED ORDER — DILTIAZEM HCL ER COATED BEADS 240 MG PO CP24: ORAL_CAPSULE | ORAL | Status: DC

## 2013-08-11 NOTE — Telephone Encounter (Signed)
Requesting recommendation for PCP.  Prefer [facility] and [place] said okay. Refilled requested bp meds per protocol.  Advised to scheduled OV.

## 2013-08-27 ENCOUNTER — Ambulatory Visit (INDEPENDENT_AMBULATORY_CARE_PROVIDER_SITE_OTHER): Payer: 59 | Admitting: Internal Medicine

## 2013-08-27 ENCOUNTER — Encounter: Payer: Self-pay | Admitting: Internal Medicine

## 2013-08-27 VITALS — BP 140/70 | HR 89 | Temp 98.2°F | Wt 149.0 lb

## 2013-08-27 DIAGNOSIS — I10 Essential (primary) hypertension: Secondary | ICD-10-CM

## 2013-08-27 DIAGNOSIS — Z23 Encounter for immunization: Secondary | ICD-10-CM

## 2013-08-27 DIAGNOSIS — Z0000 Encounter for general adult medical examination without abnormal findings: Secondary | ICD-10-CM

## 2013-08-27 MED ORDER — DILTIAZEM HCL ER COATED BEADS 240 MG PO CP24: ORAL_CAPSULE | ORAL | Status: DC

## 2013-08-27 MED ORDER — HYDROCHLOROTHIAZIDE 25 MG PO TABS: ORAL_TABLET | ORAL | Status: DC

## 2013-08-27 MED ORDER — POTASSIUM CHLORIDE ER 10 MEQ PO TBCR: EXTENDED_RELEASE_TABLET | ORAL | Status: DC

## 2013-08-27 MED ORDER — LISINOPRIL 20 MG PO TABS
ORAL_TABLET | ORAL | Status: DC
Start: 2013-08-27 — End: 2013-08-27

## 2013-08-27 MED ORDER — LISINOPRIL 20 MG PO TABS: ORAL_TABLET | ORAL | Status: DC

## 2013-08-27 NOTE — Progress Notes (Signed)
Pre visit review using our clinic review tool, if applicable. No additional management support is needed unless otherwise documented below in the visit note. 

## 2013-08-27 NOTE — Patient Instructions (Signed)
Thanks for coming in.  Immunization - Prevnar pneumonia vaccine today , once and done; pneumovax in 2016 booster; shingles in 6-8 weeks.  Med refills done.  New physician - will put in a request for Dr. Alain Marion but if this doesn't work please consider [place] [facility].

## 2013-08-27 NOTE — Progress Notes (Signed)
   Subjective:    Patient ID: Jillian Hartman, female    DOB: 1950/08/09, 63 y.o.   MRN: [id number]  HPI Mx. Entwistle presents for medication follow up and wellness. She is due for Prevnar and will return for shingles vaccine.   Interval history - she has been doing well w/o major illness, injury or surgery. She is current with Gyn, dental and eye care. She follows a fairly healthy diet, does not exercise enough, rests well. She is fully I-ADLs. 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 Family is good - mother, Jillian Hartman, is doing OK.  Past Medical History  Diagnosis Date  . Varicose vein   . Borderline hyperlipidemia   . Hypertension   . Tonsillitis 12/29/2011   Past Surgical History  Procedure Laterality Date  . Abdominal hysterectomy    . Laparoscopy      '82 fertility work up  . Cervical conization w/bx      '77 after abnormal pap   Family History  Problem Relation Age of Onset  . Hypertension Mother   . Hypertension Father   . Ulcers Father   . Cancer Father     oral cancer   History   Social History  . Marital Status: Divorced    Spouse Name: N/A    Number of Children: 2  . Years of Education: 16   Occupational History  . real estate sales    Social History Main Topics  . Smoking status: Never Smoker   . Smokeless tobacco: Not on file  . Alcohol Use: Not on file  . Drug Use: Not on file  . Sexual Activity: Not on file   Other Topics Concern  . 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 Married 36- divorced [phone] son 32- 1 daughter died in MVA 9th grade. Monogamous relationship since 1998. 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                Current Outpatient Prescriptions on File Prior to Visit  Medication Sig Dispense Refill  . MULTIPLE VITAMIN PO Take by mouth.         No current facility-administered medications on file prior to visit.       Review of Systems System review is negative for any constitutional, cardiac, pulmonary, GI or neuro symptoms or complaints other than as described in the HPI.     Objective:   Physical Exam Filed Vitals:   08/27/13 1516  BP: 140/70  Pulse: 89  Temp: 98.2 F (36.8 C)   Wt Readings from Last 3 Encounters:  08/27/13 149 lb (67.586 kg)  12/29/11 150 lb (68.04 kg)  04/12/11 162 lb (73.483 kg)   BP Readings from Last 3 Encounters:  08/27/13 140/70  12/29/11 134/76  10/25/11 138/82   Gen'l - WNWD, professionally groomed woman in no distress HEENT- C&S clear, PERRLA, EOMI Neck - supple Cor - 2+ radial pulse, RRR Pul  - Normal respirations Neuro - A&O x 3, CN II-XII normal, normal strength, normal gait.          Assessment & Plan:

## 2013-08-28 ENCOUNTER — Telehealth: Payer: Self-pay

## 2013-08-28 MED ORDER — DILTIAZEM HCL ER COATED BEADS 240 MG PO CP24: ORAL_CAPSULE | ORAL | Status: DC

## 2013-08-28 NOTE — Telephone Encounter (Signed)
Phone call from patient [phone] stating on the Diltiazem that pharmacy did not refill for a year only six months. I let her know this was filled for a year but will resend it

## 2013-09-02 NOTE — Assessment & Plan Note (Signed)
Interval history - last OV for routine care 2012. She was seen in 2013 for sore throat. She has been healthy. Limited physical exam is normal. She reports being current with Gyn. She is due for colonoscopy with no report on file. She had mammography in 2012 and DEXA in 2012. Immunizations - Prevnar today, due for shingles vaccine.  In summary - a nice woman who has several health maintenance needs. Will communicate via letter that she have routine lab: CMet, Lipids, TSH; the she be advised to have mammogram and colonoscopy scheduled. She will be establishing with a new primary care provider and should have a follow up visit after labs are done.

## 2013-09-02 NOTE — Assessment & Plan Note (Addendum)
BP Readings from Last 3 Encounters:  08/27/13 140/70  12/29/11 134/76  10/25/11 138/82   Adequate control on three drug regimen. BMET    Component Value Date/Time   NA 141 04/12/2011 1354   K 3.0* 04/12/2011 1354   CL 100 04/12/2011 1354   CO2 33* 04/12/2011 1354   GLUCOSE 99 04/12/2011 1354   GLUCOSE 97 03/27/2006 1226   BUN 15 04/12/2011 1354   CREATININE 0.7 04/12/2011 1354   CALCIUM 9.9 04/12/2011 1354   GFRNONAA 77.45 09/14/2009 1615   GFRAA 94 06/15/2008 1605   Plan Continue present medications  Patient will be asked to return for lab work.

## 2014-04-02 ENCOUNTER — Other Ambulatory Visit: Payer: Self-pay | Admitting: Geriatric Medicine

## 2014-04-02 MED ORDER — LISINOPRIL 20 MG PO TABS: ORAL_TABLET | ORAL | Status: DC

## 2014-04-02 MED ORDER — HYDROCHLOROTHIAZIDE 25 MG PO TABS: ORAL_TABLET | ORAL | Status: DC

## 2014-04-02 MED ORDER — POTASSIUM CHLORIDE ER 10 MEQ PO TBCR: EXTENDED_RELEASE_TABLET | ORAL | Status: DC

## 2014-07-07 ENCOUNTER — Other Ambulatory Visit: Payer: Self-pay | Admitting: Dermatology

## 2014-09-29 ENCOUNTER — Other Ambulatory Visit: Payer: Self-pay | Admitting: Internal Medicine

## 2014-11-01 ENCOUNTER — Other Ambulatory Visit: Payer: Self-pay | Admitting: Internal Medicine

## 2014-11-03 ENCOUNTER — Other Ambulatory Visit: Payer: Self-pay | Admitting: Internal Medicine

## 2014-12-01 ENCOUNTER — Telehealth: Payer: Self-pay | Admitting: Internal Medicine

## 2014-12-01 ENCOUNTER — Other Ambulatory Visit: Payer: Self-pay | Admitting: Internal Medicine

## 2014-12-01 NOTE — Telephone Encounter (Signed)
OK w/me Thx 

## 2014-12-01 NOTE — Telephone Encounter (Signed)
Spoke to patient. She had requested dr plotnikov to become her PCP last march and never heard anything.  Dr Alain Marion, would you like to accept this patient under your care ? Thanks

## 2015-01-02 ENCOUNTER — Other Ambulatory Visit: Payer: Self-pay | Admitting: Internal Medicine

## 2015-01-04 ENCOUNTER — Other Ambulatory Visit: Payer: Self-pay | Admitting: Internal Medicine

## 2015-01-04 NOTE — Telephone Encounter (Signed)
Called and scheduled an appt.

## 2015-01-04 NOTE — Telephone Encounter (Signed)
Jillian Hartman, please schedule OV with Plot. I have refilled her meds x 30 days.

## 2015-02-02 ENCOUNTER — Other Ambulatory Visit: Payer: Self-pay | Admitting: Internal Medicine

## 2015-02-10 ENCOUNTER — Other Ambulatory Visit: Payer: Self-pay | Admitting: Internal Medicine

## 2015-02-16 ENCOUNTER — Ambulatory Visit (INDEPENDENT_AMBULATORY_CARE_PROVIDER_SITE_OTHER): Payer: 59 | Admitting: Internal Medicine

## 2015-02-16 ENCOUNTER — Other Ambulatory Visit (INDEPENDENT_AMBULATORY_CARE_PROVIDER_SITE_OTHER): Payer: 59

## 2015-02-16 ENCOUNTER — Encounter: Payer: Self-pay | Admitting: Internal Medicine

## 2015-02-16 VITALS — BP 150/80 | HR 82 | Ht 64.0 in | Wt 159.0 lb

## 2015-02-16 DIAGNOSIS — Z0000 Encounter for general adult medical examination without abnormal findings: Secondary | ICD-10-CM

## 2015-02-16 DIAGNOSIS — I498 Other specified cardiac arrhythmias: Secondary | ICD-10-CM

## 2015-02-16 DIAGNOSIS — E876 Hypokalemia: Secondary | ICD-10-CM

## 2015-02-16 DIAGNOSIS — I499 Cardiac arrhythmia, unspecified: Secondary | ICD-10-CM | POA: Diagnosis not present

## 2015-02-16 DIAGNOSIS — R008 Other abnormalities of heart beat: Secondary | ICD-10-CM | POA: Insufficient documentation

## 2015-02-16 DIAGNOSIS — I10 Essential (primary) hypertension: Secondary | ICD-10-CM

## 2015-02-16 DIAGNOSIS — R609 Edema, unspecified: Secondary | ICD-10-CM | POA: Diagnosis not present

## 2015-02-16 LAB — URINALYSIS
BILIRUBIN URINE: NEGATIVE
KETONES UR: NEGATIVE
LEUKOCYTES UA: NEGATIVE
Nitrite: NEGATIVE
PH: 5.5 (ref 5.0–8.0)
Specific Gravity, Urine: <=1.005 — AB (ref 1.000–1.030)
Total Protein, Urine: NEGATIVE
UROBILINOGEN UA: 0.2 (ref 0.0–1.0)
Urine Glucose: NEGATIVE

## 2015-02-16 LAB — BASIC METABOLIC PANEL
BUN: 22 mg/dL (ref 6–23)
CALCIUM: 10 mg/dL (ref 8.4–10.5)
CO2: 30 meq/L (ref 19–32)
Chloride: 94 mEq/L — ABNORMAL LOW (ref 96–112)
Creatinine, Ser: 0.75 mg/dL (ref 0.40–1.20)
GFR: 82.01 mL/min (ref >60.00)
Glucose, Bld: 104 mg/dL — ABNORMAL HIGH (ref 70–99)
POTASSIUM: 3.1 meq/L — AB (ref 3.5–5.1)
SODIUM: 136 meq/L (ref 135–145)

## 2015-02-16 LAB — HEPATIC FUNCTION PANEL
ALBUMIN: 4.3 g/dL (ref 3.5–5.2)
ALK PHOS: 95 U/L (ref 39–117)
ALT: 9 U/L (ref 0–35)
AST: 14 U/L (ref 0–37)
Bilirubin, Direct: 0.1 mg/dL (ref 0.0–0.3)
TOTAL PROTEIN: 7.2 g/dL (ref 6.0–8.3)
Total Bilirubin: 0.5 mg/dL (ref 0.2–1.2)

## 2015-02-16 LAB — CBC WITH DIFFERENTIAL/PLATELET
BASOS ABS: 0 10*3/uL (ref 0.0–0.1)
Basophils Relative: 0.4 % (ref 0.0–3.0)
Eosinophils Absolute: 0 10*3/uL (ref 0.0–0.7)
Eosinophils Relative: 0.2 % (ref 0.0–5.0)
HEMATOCRIT: 40.3 % (ref 36.0–46.0)
Hemoglobin: 13.9 g/dL (ref 12.0–15.0)
LYMPHS PCT: 24.7 % (ref 12.0–46.0)
Lymphs Abs: 2.1 10*3/uL (ref 0.7–4.0)
MCHC: 34.5 g/dL (ref 30.0–36.0)
MCV: 92.5 fl (ref 78.0–100.0)
MONOS PCT: 8.4 % (ref 3.0–12.0)
Monocytes Absolute: 0.7 10*3/uL (ref 0.1–1.0)
NEUTROS ABS: 5.7 10*3/uL (ref 1.4–7.7)
Neutrophils Relative %: 66.3 % (ref 43.0–77.0)
Platelets: 267 10*3/uL (ref 150.0–400.0)
RBC: 4.36 Mil/uL (ref 3.87–5.11)
RDW: 13.2 % (ref 11.5–15.5)
WBC: 8.6 10*3/uL (ref 4.0–10.5)

## 2015-02-16 LAB — LIPID PANEL
Cholesterol: 236 mg/dL — ABNORMAL HIGH (ref 0–200)
HDL: 68.4 mg/dL (ref >39.00)
LDL Cholesterol: 149 mg/dL — ABNORMAL HIGH (ref 0–99)
NONHDL: 167.66
Total CHOL/HDL Ratio: 3
Triglycerides: 92 mg/dL (ref 0.0–149.0)
VLDL: 18.4 mg/dL (ref 0.0–40.0)

## 2015-02-16 LAB — TSH: TSH: 1.1 u[IU]/mL (ref 0.35–4.50)

## 2015-02-16 MED ORDER — DILTIAZEM HCL ER COATED BEADS 120 MG PO CP24: 120.0000 mg | ORAL_CAPSULE | Freq: Every day | ORAL | Status: DC

## 2015-02-16 MED ORDER — VITAMIN D 1000 UNITS PO TABS: 1000.0000 [IU] | ORAL_TABLET | Freq: Every day | ORAL | Status: AC

## 2015-02-16 MED ORDER — POTASSIUM CHLORIDE CRYS ER 10 MEQ PO TBCR: 10.0000 meq | EXTENDED_RELEASE_TABLET | Freq: Every day | ORAL | Status: DC

## 2015-02-16 MED ORDER — LISINOPRIL 40 MG PO TABS: 40.0000 mg | ORAL_TABLET | Freq: Every day | ORAL | Status: DC

## 2015-02-16 MED ORDER — HYDROCHLOROTHIAZIDE 25 MG PO TABS: 25.0000 mg | ORAL_TABLET | Freq: Every day | ORAL | Status: DC

## 2015-02-16 NOTE — Progress Notes (Signed)
Subjective:  Patient ID: Jillian Hartman, female    DOB: January 18, 1951  Age: 64 y.o. MRN: [id number]  CC: Establish Care   HPI Jillian Hartman presents for a well exam - new pt (not new to practice) C/o chronic LE swelling. F/u HTN  Outpatient Prescriptions Prior to Visit  Medication Sig Dispense Refill  . MULTIPLE VITAMIN PO Take by mouth.      . diltiazem (CARDIZEM CD) 240 MG 24 hr capsule TAKE ONE CAPSULE BY MOUTH EVERY DAY 30 capsule 0  . hydrochlorothiazide (HYDRODIURIL) 25 MG tablet TAKE 1 TABLET (25 MG TOTAL) BY MOUTH DAILY. 30 tablet 1  . KLOR-CON M10 10 MEQ tablet TAKE 1 TABLET BY MOUTH EVERY DAY 30 tablet 1  . lisinopril (PRINIVIL,ZESTRIL) 20 MG tablet TAKE 1 TABLET (20 MG TOTAL) BY MOUTH DAILY. 30 tablet 6  . lisinopril (PRINIVIL,ZESTRIL) 20 MG tablet TAKE 1 TABLET (20 MG TOTAL) BY MOUTH DAILY. 30 tablet 6  . lisinopril (PRINIVIL,ZESTRIL) 20 MG tablet TAKE 1 TABLET (20 MG TOTAL) BY MOUTH DAILY. 30 tablet 6   No facility-administered medications prior to visit.    ROS Review of Systems  Constitutional: Negative for chills, activity change, appetite change, fatigue and unexpected weight change.  HENT: Negative for congestion, mouth sores and sinus pressure.   Eyes: Negative for visual disturbance.  Respiratory: Negative for cough and chest tightness.   Cardiovascular: Positive for leg swelling.  Gastrointestinal: Negative for nausea and abdominal pain.  Genitourinary: Negative for frequency, difficulty urinating and vaginal pain.  Musculoskeletal: Negative for back pain and gait problem.  Skin: Negative for pallor and rash.  Neurological: Negative for dizziness, tremors, weakness, numbness and headaches.  Psychiatric/Behavioral: Negative for confusion and sleep disturbance.    Objective:  BP 150/80 mmHg  Pulse 82  Ht '5\' 4"'$  (1.626 m)  Wt 159 lb (72.122 kg)  BMI 27.28 kg/m2  SpO2 97%  BP Readings from Last 3 Encounters:  02/16/15 150/80  08/27/13 140/70    12/29/11 134/76    Wt Readings from Last 3 Encounters:  02/16/15 159 lb (72.122 kg)  08/27/13 149 lb (67.586 kg)  12/29/11 150 lb (68.04 kg)    Physical Exam  Constitutional: She appears well-developed. No distress.  HENT:  Head: Normocephalic.  Right Ear: External ear normal.  Left Ear: External ear normal.  Nose: Nose normal.  Mouth/Throat: Oropharynx is clear and moist.  Eyes: Conjunctivae are normal. Pupils are equal, round, and reactive to light. Right eye exhibits no discharge. Left eye exhibits no discharge.  Neck: Normal range of motion. Neck supple. No JVD present. No tracheal deviation present. No thyromegaly present.  Cardiovascular: Normal rate and normal heart sounds.   Pulmonary/Chest: No stridor. No respiratory distress. She has no wheezes.  Abdominal: Soft. Bowel sounds are normal. She exhibits no distension and no mass. There is no tenderness. There is no rebound and no guarding.  Musculoskeletal: She exhibits edema. She exhibits no tenderness.  Lymphadenopathy:    She has no cervical adenopathy.  Neurological: She displays normal reflexes. No cranial nerve deficit. She exhibits normal muscle tone. Coordination normal.  Skin: No rash noted. No erythema.  Psychiatric: She has a normal mood and affect. Her behavior is normal. Judgment and thought content normal.  Irreg beats Wax R>L B LE edema 1+  Lab Results  Component Value Date   WBC 8.6 02/16/2015   HGB 13.9 02/16/2015   HCT 40.3 02/16/2015   PLT 267.0 02/16/2015   GLUCOSE 104* 02/16/2015  CHOL 236* 02/16/2015   TRIG 92.0 02/16/2015   HDL 68.40 02/16/2015   LDLDIRECT 143.5 06/15/2008   LDLCALC 149* 02/16/2015   ALT 9 02/16/2015   AST 14 02/16/2015   NA 136 02/16/2015   K 3.1* 02/16/2015   CL 94* 02/16/2015   CREATININE 0.75 02/16/2015   BUN 22 02/16/2015   CO2 30 02/16/2015   TSH 1.10 02/16/2015    No results found.  Assessment & Plan:   Jillian Hartman was seen today for establish  care.  Diagnoses and all orders for this visit:  Well adult exam -     Basic metabolic panel; Future -     CBC with Differential/Platelet; Future -     Hepatic function panel; Future -     Lipid panel; Future -     TSH; Future -     Urinalysis; Future  Edema  Irregular heart rate -     EKG 12-Lead  Trigeminy  Essential hypertension  Hypokalemia  Other orders -     Cancel: diltiazem (CARDIZEM CD) 240 MG 24 hr capsule; Take 1 capsule (240 mg total) by mouth daily. -     hydrochlorothiazide (HYDRODIURIL) 25 MG tablet; Take 1 tablet (25 mg total) by mouth daily. -     Discontinue: potassium chloride (KLOR-CON M10) 10 MEQ tablet; Take 1 tablet (10 mEq total) by mouth daily. -     Cancel: lisinopril (PRINIVIL,ZESTRIL) 20 MG tablet; Take 1 tablet (20 mg total) by mouth daily. -     cholecalciferol (VITAMIN D) 1000 UNITS tablet; Take 1 tablet (1,000 Units total) by mouth daily. -     lisinopril (PRINIVIL,ZESTRIL) 40 MG tablet; Take 1 tablet (40 mg total) by mouth daily. -     diltiazem (CARDIZEM CD) 120 MG 24 hr capsule; Take 1 capsule (120 mg total) by mouth daily.   I have discontinued Jillian Hartman's lisinopril, lisinopril, lisinopril, diltiazem, and KLOR-CON M10. I am also having her start on cholecalciferol, lisinopril, and diltiazem. Additionally, I am having her maintain her MULTIPLE VITAMIN PO and hydrochlorothiazide.  Meds ordered this encounter  Medications  . hydrochlorothiazide (HYDRODIURIL) 25 MG tablet    Sig: Take 1 tablet (25 mg total) by mouth daily.    Dispense:  30 tablet    Refill:  11  . DISCONTD: potassium chloride (KLOR-CON M10) 10 MEQ tablet    Sig: Take 1 tablet (10 mEq total) by mouth daily.    Dispense:  30 tablet    Refill:  11  . cholecalciferol (VITAMIN D) 1000 UNITS tablet    Sig: Take 1 tablet (1,000 Units total) by mouth daily.    Dispense:  100 tablet    Refill:  3  . lisinopril (PRINIVIL,ZESTRIL) 40 MG tablet    Sig: Take 1 tablet (40 mg  total) by mouth daily.    Dispense:  30 tablet    Refill:  11  . diltiazem (CARDIZEM CD) 120 MG 24 hr capsule    Sig: Take 1 capsule (120 mg total) by mouth daily.    Dispense:  30 capsule    Refill:  11   EKG NSR w/trigeminy  Follow-up: Return in about 3 months (around 05/18/2015) for a follow-up visit.  Walker Kehr, MD

## 2015-02-16 NOTE — Assessment & Plan Note (Addendum)
EKG NSR w/trigeminy  25+ years Treat low K

## 2015-02-16 NOTE — Progress Notes (Signed)
Pre visit review using our clinic review tool, if applicable. No additional management support is needed unless otherwise documented below in the visit note. 

## 2015-02-18 ENCOUNTER — Other Ambulatory Visit: Payer: Self-pay | Admitting: Internal Medicine

## 2015-02-18 MED ORDER — POTASSIUM CHLORIDE CRYS ER 10 MEQ PO TBCR: 10.0000 meq | EXTENDED_RELEASE_TABLET | Freq: Two times a day (BID) | ORAL | Status: DC

## 2015-02-19 DIAGNOSIS — Z0000 Encounter for general adult medical examination without abnormal findings: Secondary | ICD-10-CM | POA: Insufficient documentation

## 2015-02-19 DIAGNOSIS — E876 Hypokalemia: Secondary | ICD-10-CM | POA: Insufficient documentation

## 2015-02-19 NOTE — Assessment & Plan Note (Signed)
We discussed age appropriate health related issues, including available/recomended screening tests and vaccinations. We discussed a need for adhering to healthy diet and exercise. Labs/EKG were reviewed/ordered. All questions were answered.   

## 2015-02-19 NOTE — Assessment & Plan Note (Signed)
Increase KCL Reduce HCTZ later

## 2015-02-19 NOTE — Assessment & Plan Note (Signed)
Chronic - likely due to Ca channel blocker side effect Reduce diltiazem dose

## 2015-02-19 NOTE — Assessment & Plan Note (Signed)
Reduce diltiazem Increase lisinopril

## 2015-06-29 ENCOUNTER — Other Ambulatory Visit: Payer: Self-pay | Admitting: Internal Medicine

## 2015-08-30 ENCOUNTER — Telehealth: Payer: Self-pay | Admitting: Internal Medicine

## 2015-08-30 NOTE — Telephone Encounter (Signed)
Left voicemail for pt to call back.  RE: Flu Vaccine 2016-2017

## 2015-10-31 ENCOUNTER — Other Ambulatory Visit: Payer: Self-pay | Admitting: Internal Medicine

## 2016-02-28 ENCOUNTER — Other Ambulatory Visit: Payer: Self-pay | Admitting: Internal Medicine

## 2016-03-28 ENCOUNTER — Other Ambulatory Visit: Payer: Self-pay | Admitting: Internal Medicine

## 2016-04-13 ENCOUNTER — Telehealth: Payer: Self-pay | Admitting: *Deleted

## 2016-04-13 MED ORDER — LISINOPRIL 20 MG PO TABS: ORAL_TABLET | ORAL | 0 refills | Status: DC

## 2016-04-13 MED ORDER — DILTIAZEM HCL ER COATED BEADS 120 MG PO CP24: 120.0000 mg | ORAL_CAPSULE | Freq: Every day | ORAL | 0 refills | Status: DC

## 2016-04-13 MED ORDER — HYDROCHLOROTHIAZIDE 25 MG PO TABS: 25.0000 mg | ORAL_TABLET | Freq: Every day | ORAL | 0 refills | Status: DC

## 2016-04-13 NOTE — Telephone Encounter (Signed)
Pt left msg on triage stating she is schedule to see MD on 11/22, but she is completely out of her BP medications. Would like refill until she see MD. Hulen Skains pt back inform can send enough in until her appt...Johny Chess

## 2016-04-25 ENCOUNTER — Encounter: Payer: Self-pay | Admitting: Internal Medicine

## 2016-04-25 ENCOUNTER — Ambulatory Visit (INDEPENDENT_AMBULATORY_CARE_PROVIDER_SITE_OTHER): Payer: 59 | Admitting: Internal Medicine

## 2016-04-25 DIAGNOSIS — S40011A Contusion of right shoulder, initial encounter: Secondary | ICD-10-CM | POA: Diagnosis not present

## 2016-04-25 NOTE — Progress Notes (Signed)
   Subjective:  Patient ID: Jillian Hartman, female    DOB: 1950-08-17  Age: 65 y.o. MRN: [id number]  CC: No chief complaint on file.   HPI Genola Yuille Mankey presents for severe pain in the shoulder: she hit her R posterior shoulder 36 h ago against a doorway in the bathroom. No fall. She did not sleep well due to pain.  Outpatient Medications Prior to Visit  Medication Sig Dispense Refill  . diltiazem (CARDIZEM CD) 120 MG 24 hr capsule Take 1 capsule (120 mg total) by mouth daily. Must keep Nov 22 appt for future refills 30 capsule 0  . hydrochlorothiazide (HYDRODIURIL) 25 MG tablet Take 1 tablet (25 mg total) by mouth daily. Must keep Nov 22 appt for future refills 30 tablet 0  . KLOR-CON M10 10 MEQ tablet TAKE 1 TABLET (10 MEQ TOTAL) BY MOUTH 2 (TWO) TIMES DAILY. 60 tablet 0  . lisinopril (PRINIVIL,ZESTRIL) 20 MG tablet TAKE 1 TABLET (20 MG TOTAL) BY MOUTH DAILY. 30 tablet 0  . MULTIPLE VITAMIN PO Take by mouth.       No facility-administered medications prior to visit.     ROS Review of Systems  Constitutional: Negative for chills and fever.  Musculoskeletal: Negative for back pain.  Skin: Negative for color change.   posterior Objective:  BP (!) 148/80   Pulse 98   Temp 98.2 F (36.8 C)   Wt 158 lb (71.7 kg)   SpO2 99%   BMI 27.12 kg/m   BP Readings from Last 3 Encounters:  04/25/16 (!) 148/80  02/16/15 (!) 150/80  08/27/13 140/70    Wt Readings from Last 3 Encounters:  04/25/16 158 lb (71.7 kg)  02/16/15 159 lb (72.1 kg)  08/27/13 149 lb (67.6 kg)    Physical Exam  Constitutional: She appears well-developed. No distress.  Abdominal: There is no tenderness.  Musculoskeletal: She exhibits tenderness. She exhibits no edema or deformity.  Neurological: Coordination normal.  Skin: No erythema. No pallor.  Psychiatric: She has a normal mood and affect.  Shoulders w/good ROM; R - limited by pain R post shoulder is tender   Lab Results  Component Value  Date   WBC 8.6 02/16/2015   HGB 13.9 02/16/2015   HCT 40.3 02/16/2015   PLT 267.0 02/16/2015   GLUCOSE 104 (H) 02/16/2015   CHOL 236 (H) 02/16/2015   TRIG 92.0 02/16/2015   HDL 68.40 02/16/2015   LDLDIRECT 143.5 06/15/2008   LDLCALC 149 (H) 02/16/2015   ALT 9 02/16/2015   AST 14 02/16/2015   NA 136 02/16/2015   K 3.1 (L) 02/16/2015   CL 94 (L) 02/16/2015   CREATININE 0.75 02/16/2015   BUN 22 02/16/2015   CO2 30 02/16/2015   TSH 1.10 02/16/2015    No results found.  Assessment & Plan:   There are no diagnoses linked to this encounter. I am having Ms. Califf maintain her MULTIPLE VITAMIN PO, KLOR-CON M10, diltiazem, hydrochlorothiazide, lisinopril, and cholecalciferol.  Meds ordered this encounter  Medications  . cholecalciferol (VITAMIN D) 1000 units tablet    Sig: Take 1,000 Units by mouth daily.     Follow-up: No Follow-up on file.  Walker Kehr, MD

## 2016-04-25 NOTE — Progress Notes (Signed)
Pre visit review using our clinic review tool, if applicable. No additional management support is needed unless otherwise documented below in the visit note. 

## 2016-04-25 NOTE — Patient Instructions (Signed)
Ice today Ice and heat

## 2016-04-25 NOTE — Assessment & Plan Note (Addendum)
Pt declined X ray Naproxen prn pc Ice, then heat

## 2016-05-16 ENCOUNTER — Other Ambulatory Visit: Payer: Self-pay | Admitting: Internal Medicine

## 2016-05-16 NOTE — Telephone Encounter (Signed)
sch OV Thx 

## 2016-06-14 ENCOUNTER — Other Ambulatory Visit: Payer: Self-pay | Admitting: Internal Medicine

## 2016-09-12 ENCOUNTER — Other Ambulatory Visit: Payer: Self-pay | Admitting: Internal Medicine

## 2016-10-10 ENCOUNTER — Telehealth: Payer: Self-pay | Admitting: *Deleted

## 2016-10-10 ENCOUNTER — Other Ambulatory Visit: Payer: Self-pay | Admitting: Internal Medicine

## 2016-10-10 MED ORDER — HYDROCHLOROTHIAZIDE 25 MG PO TABS: 25.0000 mg | ORAL_TABLET | Freq: Every day | ORAL | 0 refills | Status: DC

## 2016-10-10 MED ORDER — DILTIAZEM HCL ER COATED BEADS 120 MG PO CP24: 120.0000 mg | ORAL_CAPSULE | Freq: Every day | ORAL | 0 refills | Status: DC

## 2016-10-10 MED ORDER — POTASSIUM CHLORIDE CRYS ER 10 MEQ PO TBCR: 10.0000 meq | EXTENDED_RELEASE_TABLET | Freq: Two times a day (BID) | ORAL | 0 refills | Status: DC

## 2016-10-10 MED ORDER — LISINOPRIL 20 MG PO TABS: 20.0000 mg | ORAL_TABLET | Freq: Every day | ORAL | 0 refills | Status: DC

## 2016-10-10 NOTE — Telephone Encounter (Signed)
Rec'd call pt states she has made cpx appt for 5/16, but is needing refills on her medications. Inform pt per office policy may send 30 day to local pharmacy until appt. [facility].../l;mb

## 2016-10-17 ENCOUNTER — Ambulatory Visit (INDEPENDENT_AMBULATORY_CARE_PROVIDER_SITE_OTHER): Payer: 59 | Admitting: Internal Medicine

## 2016-10-17 VITALS — BP 140/74 | HR 89 | Temp 98.6°F | Ht 64.0 in | Wt 154.0 lb

## 2016-10-17 DIAGNOSIS — Z0000 Encounter for general adult medical examination without abnormal findings: Secondary | ICD-10-CM | POA: Diagnosis not present

## 2016-10-17 DIAGNOSIS — I10 Essential (primary) hypertension: Secondary | ICD-10-CM

## 2016-10-17 DIAGNOSIS — E876 Hypokalemia: Secondary | ICD-10-CM | POA: Diagnosis not present

## 2016-10-17 MED ORDER — POTASSIUM CHLORIDE CRYS ER 10 MEQ PO TBCR: 10.0000 meq | EXTENDED_RELEASE_TABLET | Freq: Two times a day (BID) | ORAL | 3 refills | Status: DC

## 2016-10-17 MED ORDER — HYDROCHLOROTHIAZIDE 25 MG PO TABS: 25.0000 mg | ORAL_TABLET | Freq: Every day | ORAL | 3 refills | Status: DC

## 2016-10-17 MED ORDER — DILTIAZEM HCL ER COATED BEADS 120 MG PO CP24: 120.0000 mg | ORAL_CAPSULE | Freq: Every day | ORAL | 3 refills | Status: DC

## 2016-10-17 MED ORDER — LISINOPRIL 20 MG PO TABS: 20.0000 mg | ORAL_TABLET | Freq: Every day | ORAL | 3 refills | Status: DC

## 2016-10-17 NOTE — Progress Notes (Signed)
Subjective:  Patient ID: Jillian Hartman, female    DOB: 1951/03/04  Age: 66 y.o. MRN: [id number]  CC: No chief complaint on file.   HPI Jillian Hartman presents for HTN f/u  Outpatient Medications Prior to Visit  Medication Sig Dispense Refill  . cholecalciferol (VITAMIN D) 1000 units tablet Take 1,000 Units by mouth daily.    . MULTIPLE VITAMIN PO Take by mouth.      . diltiazem (CARDIZEM CD) 120 MG 24 hr capsule Take 1 capsule (120 mg total) by mouth daily. Must keep May 16th appt for future refills 30 capsule 0  . hydrochlorothiazide (HYDRODIURIL) 25 MG tablet Take 1 tablet (25 mg total) by mouth daily. Must keep May 16th appt for future refills 30 tablet 0  . lisinopril (PRINIVIL,ZESTRIL) 20 MG tablet Take 1 tablet (20 mg total) by mouth daily. Must keep May 16th appt for future refills 30 tablet 0  . potassium chloride (KLOR-CON M10) 10 MEQ tablet Take 1 tablet (10 mEq total) by mouth 2 (two) times daily. Must keep May 16th appt for future refills 60 tablet 0   No facility-administered medications prior to visit.     ROS Review of Systems  Constitutional: Negative for activity change, appetite change, chills, fatigue and unexpected weight change.  HENT: Negative for congestion, mouth sores and sinus pressure.   Eyes: Negative for visual disturbance.  Respiratory: Negative for cough and chest tightness.   Gastrointestinal: Negative for abdominal pain and nausea.  Genitourinary: Negative for difficulty urinating, frequency and vaginal pain.  Musculoskeletal: Negative for back pain and gait problem.  Skin: Negative for pallor and rash.  Neurological: Negative for dizziness, tremors, weakness, numbness and headaches.  Psychiatric/Behavioral: Negative for confusion and sleep disturbance.    Objective:  BP 140/74 (BP Location: Left Arm, Patient Position: Sitting, Cuff Size: Large)   Pulse 89   Temp 98.6 F (37 C) (Oral)   Ht '5\' 4"'$  (1.626 m)   Wt 154 lb (69.9 kg)    SpO2 96%   BMI 26.43 kg/m   BP Readings from Last 3 Encounters:  10/17/16 140/74  04/25/16 (!) 148/80  02/16/15 (!) 150/80    Wt Readings from Last 3 Encounters:  10/17/16 154 lb (69.9 kg)  04/25/16 158 lb (71.7 kg)  02/16/15 159 lb (72.1 kg)    Physical Exam  Constitutional: She appears well-developed. No distress.  HENT:  Head: Normocephalic.  Right Ear: External ear normal.  Left Ear: External ear normal.  Nose: Nose normal.  Mouth/Throat: Oropharynx is clear and moist.  Eyes: Conjunctivae are normal. Pupils are equal, round, and reactive to light. Right eye exhibits no discharge. Left eye exhibits no discharge.  Neck: Normal range of motion. Neck supple. No JVD present. No tracheal deviation present. No thyromegaly present.  Cardiovascular: Normal rate, regular rhythm and normal heart sounds.   Pulmonary/Chest: No stridor. No respiratory distress. She has no wheezes.  Abdominal: Soft. Bowel sounds are normal. She exhibits no distension and no mass. There is no tenderness. There is no rebound and no guarding.  Musculoskeletal: She exhibits no edema or tenderness.  Lymphadenopathy:    She has no cervical adenopathy.  Neurological: She displays normal reflexes. No cranial nerve deficit. She exhibits normal muscle tone. Coordination normal.  Skin: No rash noted. No erythema.  Psychiatric: She has a normal mood and affect. Her behavior is normal. Judgment and thought content normal.    Lab Results  Component Value Date   WBC 8.6  02/16/2015   HGB 13.9 02/16/2015   HCT 40.3 02/16/2015   PLT 267.0 02/16/2015   GLUCOSE 104 (H) 02/16/2015   CHOL 236 (H) 02/16/2015   TRIG 92.0 02/16/2015   HDL 68.40 02/16/2015   LDLDIRECT 143.5 06/15/2008   LDLCALC 149 (H) 02/16/2015   ALT 9 02/16/2015   AST 14 02/16/2015   NA 136 02/16/2015   K 3.1 (L) 02/16/2015   CL 94 (L) 02/16/2015   CREATININE 0.75 02/16/2015   BUN 22 02/16/2015   CO2 30 02/16/2015   TSH 1.10 02/16/2015     No results found.  Assessment & Plan:   Diagnoses and all orders for this visit:  Well adult exam -     Basic metabolic panel; Future -     CBC with Differential/Platelet; Future -     Hepatic function panel; Future -     Lipid panel; Future -     TSH; Future -     Urinalysis; Future  Essential hypertension  Hypokalemia  Other orders -     diltiazem (CARDIZEM CD) 120 MG 24 hr capsule; Take 1 capsule (120 mg total) by mouth daily. -     hydrochlorothiazide (HYDRODIURIL) 25 MG tablet; Take 1 tablet (25 mg total) by mouth daily. -     lisinopril (PRINIVIL,ZESTRIL) 20 MG tablet; Take 1 tablet (20 mg total) by mouth daily. -     potassium chloride (KLOR-CON M10) 10 MEQ tablet; Take 1 tablet (10 mEq total) by mouth 2 (two) times daily.   I have changed Jillian Hartman's diltiazem, hydrochlorothiazide, lisinopril, and potassium chloride. I am also having her maintain her MULTIPLE VITAMIN PO and cholecalciferol.  Meds ordered this encounter  Medications  . diltiazem (CARDIZEM CD) 120 MG 24 hr capsule    Sig: Take 1 capsule (120 mg total) by mouth daily.    Dispense:  90 capsule    Refill:  3  . hydrochlorothiazide (HYDRODIURIL) 25 MG tablet    Sig: Take 1 tablet (25 mg total) by mouth daily.    Dispense:  90 tablet    Refill:  3  . lisinopril (PRINIVIL,ZESTRIL) 20 MG tablet    Sig: Take 1 tablet (20 mg total) by mouth daily.    Dispense:  90 tablet    Refill:  3  . potassium chloride (KLOR-CON M10) 10 MEQ tablet    Sig: Take 1 tablet (10 mEq total) by mouth 2 (two) times daily.    Dispense:  180 tablet    Refill:  3     Follow-up: Return in about 1 year (around 10/17/2017) for Wellness Exam.  Walker Kehr, MD

## 2016-10-17 NOTE — Assessment & Plan Note (Signed)
Labs

## 2016-10-17 NOTE — Patient Instructions (Signed)
Shinrix

## 2016-11-08 ENCOUNTER — Other Ambulatory Visit: Payer: Self-pay | Admitting: Internal Medicine

## 2016-12-17 ENCOUNTER — Other Ambulatory Visit: Payer: Self-pay | Admitting: Internal Medicine

## 2017-10-27 ENCOUNTER — Other Ambulatory Visit: Payer: Self-pay | Admitting: Internal Medicine

## 2017-10-28 ENCOUNTER — Other Ambulatory Visit: Payer: Self-pay | Admitting: Internal Medicine

## 2017-11-23 ENCOUNTER — Other Ambulatory Visit: Payer: Self-pay | Admitting: Internal Medicine

## 2018-01-20 ENCOUNTER — Other Ambulatory Visit: Payer: Self-pay | Admitting: Internal Medicine

## 2018-02-07 ENCOUNTER — Other Ambulatory Visit: Payer: Self-pay | Admitting: Internal Medicine

## 2018-02-08 ENCOUNTER — Other Ambulatory Visit: Payer: Self-pay | Admitting: Internal Medicine

## 2018-02-09 ENCOUNTER — Other Ambulatory Visit: Payer: Self-pay | Admitting: Internal Medicine

## 2018-03-05 ENCOUNTER — Other Ambulatory Visit: Payer: Self-pay | Admitting: Internal Medicine

## 2018-03-07 ENCOUNTER — Telehealth: Payer: Self-pay | Admitting: Internal Medicine

## 2018-03-07 NOTE — Telephone Encounter (Signed)
Patient has set up an appointment for 11.6 / Can we call her another month in until her appointment.

## 2018-03-07 NOTE — Telephone Encounter (Signed)
Pt has not been seen since 10/2016. request was denied, pt needs appt

## 2018-03-07 NOTE — Telephone Encounter (Signed)
Copied from [place] [id number]. Topic: Quick Communication - Rx Refill/Question >> Mar 07, 2018  1:42 PM Jillian Hartman wrote: Medication: hydrochlorothiazide (HYDRODIURIL) 25 MG tablet [[id number]]   Has the patient contacted their pharmacy? Yes.   (Agent: If no, request that the patient contact the pharmacy for the refill.) (Agent: If yes, when and what did the pharmacy advise?)  [facility] Pharm[facility] (with phone number [facility]  Agent: Please be advised that RX refills may take up to 3 business days. We ask that you follow-up with your pharmacy.

## 2018-03-10 MED ORDER — HYDROCHLOROTHIAZIDE 25 MG PO TABS: 25.0000 mg | ORAL_TABLET | Freq: Every day | ORAL | 0 refills | Status: DC

## 2018-03-10 NOTE — Telephone Encounter (Signed)
RX sent

## 2018-04-09 ENCOUNTER — Encounter: Payer: Self-pay | Admitting: Internal Medicine

## 2018-04-09 ENCOUNTER — Other Ambulatory Visit (INDEPENDENT_AMBULATORY_CARE_PROVIDER_SITE_OTHER): Payer: 59

## 2018-04-09 ENCOUNTER — Ambulatory Visit (INDEPENDENT_AMBULATORY_CARE_PROVIDER_SITE_OTHER): Payer: 59 | Admitting: Internal Medicine

## 2018-04-09 DIAGNOSIS — Z0000 Encounter for general adult medical examination without abnormal findings: Secondary | ICD-10-CM | POA: Diagnosis not present

## 2018-04-09 LAB — BASIC METABOLIC PANEL
BUN: 15 mg/dL (ref 6–23)
CO2: 29 mEq/L (ref 19–32)
CREATININE: 0.97 mg/dL (ref 0.40–1.20)
Calcium: 10.3 mg/dL (ref 8.4–10.5)
Chloride: 100 mEq/L (ref 96–112)
GFR: 60.38 mL/min (ref >60.00)
Glucose, Bld: 116 mg/dL — ABNORMAL HIGH (ref 70–99)
Potassium: 3.4 mEq/L — ABNORMAL LOW (ref 3.5–5.1)
Sodium: 139 mEq/L (ref 135–145)

## 2018-04-09 LAB — URINALYSIS
BILIRUBIN URINE: NEGATIVE
HGB URINE DIPSTICK: NEGATIVE
Ketones, ur: NEGATIVE
Leukocytes, UA: NEGATIVE
Nitrite: NEGATIVE
PH: 5.5 (ref 5.0–8.0)
Specific Gravity, Urine: 1.02 (ref 1.000–1.030)
TOTAL PROTEIN, URINE-UPE24: NEGATIVE
URINE GLUCOSE: NEGATIVE
Urobilinogen, UA: 0.2 (ref 0.0–1.0)

## 2018-04-09 LAB — CBC WITH DIFFERENTIAL/PLATELET
BASOS PCT: 0.6 % (ref 0.0–3.0)
Basophils Absolute: 0 10*3/uL (ref 0.0–0.1)
EOS ABS: 0 10*3/uL (ref 0.0–0.7)
EOS PCT: 0.5 % (ref 0.0–5.0)
HEMATOCRIT: 41.9 % (ref 36.0–46.0)
HEMOGLOBIN: 14.4 g/dL (ref 12.0–15.0)
LYMPHS PCT: 37.4 % (ref 12.0–46.0)
Lymphs Abs: 2.6 10*3/uL (ref 0.7–4.0)
MCHC: 34.5 g/dL (ref 30.0–36.0)
MCV: 93.7 fl (ref 78.0–100.0)
MONO ABS: 0.6 10*3/uL (ref 0.1–1.0)
Monocytes Relative: 9.2 % (ref 3.0–12.0)
Neutro Abs: 3.7 10*3/uL (ref 1.4–7.7)
Neutrophils Relative %: 52.3 % (ref 43.0–77.0)
PLATELETS: 238 10*3/uL (ref 150.0–400.0)
RBC: 4.47 Mil/uL (ref 3.87–5.11)
RDW: 13.2 % (ref 11.5–15.5)
WBC: 7 10*3/uL (ref 4.0–10.5)

## 2018-04-09 LAB — LIPID PANEL
CHOLESTEROL: 221 mg/dL — AB (ref 0–200)
HDL: 68.7 mg/dL (ref >39.00)
LDL CALC: 136 mg/dL — AB (ref 0–99)
NonHDL: 152.65
TRIGLYCERIDES: 81 mg/dL (ref 0.0–149.0)
Total CHOL/HDL Ratio: 3
VLDL: 16.2 mg/dL (ref 0.0–40.0)

## 2018-04-09 LAB — HEPATIC FUNCTION PANEL
ALT: 14 U/L (ref 0–35)
AST: 18 U/L (ref 0–37)
Albumin: 4.3 g/dL (ref 3.5–5.2)
Alkaline Phosphatase: 90 U/L (ref 39–117)
BILIRUBIN DIRECT: 0.1 mg/dL (ref 0.0–0.3)
BILIRUBIN TOTAL: 0.7 mg/dL (ref 0.2–1.2)
Total Protein: 7.1 g/dL (ref 6.0–8.3)

## 2018-04-09 LAB — TSH: TSH: 0.81 u[IU]/mL (ref 0.35–4.50)

## 2018-04-09 MED ORDER — HYDROCHLOROTHIAZIDE 25 MG PO TABS: 25.0000 mg | ORAL_TABLET | Freq: Every day | ORAL | 3 refills | Status: DC

## 2018-04-09 MED ORDER — DILTIAZEM HCL ER COATED BEADS 120 MG PO CP24: 120.0000 mg | ORAL_CAPSULE | Freq: Every day | ORAL | 3 refills | Status: DC

## 2018-04-09 MED ORDER — LISINOPRIL 20 MG PO TABS: 20.0000 mg | ORAL_TABLET | Freq: Every day | ORAL | 3 refills | Status: DC

## 2018-04-09 MED ORDER — POTASSIUM CHLORIDE CRYS ER 10 MEQ PO TBCR: 10.0000 meq | EXTENDED_RELEASE_TABLET | Freq: Two times a day (BID) | ORAL | 3 refills | Status: DC

## 2018-04-09 NOTE — Assessment & Plan Note (Addendum)
We discussed age appropriate health related issues, including available/recomended screening tests and vaccinations. We discussed a need for adhering to healthy diet and exercise. Labs were ordered to be later reviewed . All questions were answered. Pt would like to do Cologuard Mammo, BDS at [facility] S/p partial hysterectomy Shingrix later

## 2018-04-09 NOTE — Patient Instructions (Addendum)
Shingrix vaccine   Cardiac CT calcium scoring test $150   Computed tomography, more commonly known as a CT or CAT scan, is a diagnostic medical imaging test. Like traditional x-rays, it produces multiple images or pictures of the inside of the body. The cross-sectional images generated during a CT scan can be reformatted in multiple planes. They can even generate three-dimensional images. These images can be viewed on a computer monitor, printed on film or by a 3D printer, or transferred to a CD or DVD. CT images of internal organs, bones, soft tissue and blood vessels provide greater detail than traditional x-rays, particularly of soft tissues and blood vessels. A cardiac CT scan for coronary calcium is a non-invasive way of obtaining information about the presence, location and extent of calcified plaque in the coronary arteries-the vessels that supply oxygen-containing blood to the heart muscle. Calcified plaque results when there is a build-up of fat and other substances under the inner layer of the artery. This material can calcify which signals the presence of atherosclerosis, a disease of the vessel wall, also called coronary artery disease (CAD). People with this disease have an increased risk for heart attacks. In addition, over time, progression of plaque build up (CAD) can narrow the arteries or even close off blood flow to the heart. The result may be chest pain, sometimes called "angina," or a heart attack. Because calcium is a marker of CAD, the amount of calcium detected on a cardiac CT scan is a helpful prognostic tool. The findings on cardiac CT are expressed as a calcium score. Another name for this test is coronary artery calcium scoring.  What are some common uses of the procedure? The goal of cardiac CT scan for calcium scoring is to determine if CAD is present and to what extent, even if there are no symptoms. It is a screening study that may be recommended by a physician for  patients with risk factors for CAD but no clinical symptoms. The major risk factors for CAD are: . high blood cholesterol levels  . family history of heart attacks  . diabetes  . high blood pressure  . cigarette smoking  . overweight or obese  . physical inactivity   A negative cardiac CT scan for calcium scoring shows no calcification within the coronary arteries. This suggests that CAD is absent or so minimal it cannot be seen by this technique. The chance of having a heart attack over the next two to five years is very low under these circumstances. A positive test means that CAD is present, regardless of whether or not the patient is experiencing any symptoms. The amount of calcification-expressed as the calcium score-may help to predict the likelihood of a myocardial infarction (heart attack) in the coming years and helps your medical doctor or cardiologist decide whether the patient may need to take preventive medicine or undertake other measures such as diet and exercise to lower the risk for heart attack. The extent of CAD is graded according to your calcium score:  Calcium Score  Presence of CAD  0 No evidence of CAD   1-10 Minimal evidence of CAD  11-100 Mild evidence of CAD  101-400 Moderate evidence of CAD  Over 400 Extensive evidence of CAD

## 2018-04-09 NOTE — Progress Notes (Signed)
Subjective:  Patient ID: Jillian Hartman, female    DOB: [birth year]  Age: 67 y.o. MRN: [id number]  CC: No chief complaint on file.   HPI Jillian Hartman presents for a well exam   Outpatient Medications Prior to Visit  Medication Sig Dispense Refill  . cholecalciferol (VITAMIN D) 1000 units tablet Take 1,000 Units by mouth daily.    Marland Kitchen diltiazem (CARDIZEM CD) 120 MG 24 hr capsule TAKE ONE CAPSULE BY MOUTH DAILY 90 capsule 3  . hydrochlorothiazide (HYDRODIURIL) 25 MG tablet Take 1 tablet (25 mg total) by mouth daily. Patient needs office visit before refills will be given 90 tablet 0  . lisinopril (PRINIVIL,ZESTRIL) 20 MG tablet TAKE 1 TABLET BY MOUTH EVERY DAY 90 tablet 2  . MULTIPLE VITAMIN PO Take by mouth.      . potassium chloride (KLOR-CON M10) 10 MEQ tablet Take 1 tablet (10 mEq total) by mouth 2 (two) times daily. Patient needs office visit before refills will be given 180 tablet 0  . diltiazem (CARDIZEM CD) 120 MG 24 hr capsule Take 1 capsule (120 mg total) by mouth daily. (Patient not taking: Reported on 04/09/2018) 90 capsule 3  . lisinopril (PRINIVIL,ZESTRIL) 20 MG tablet Take 1 tablet (20 mg total) by mouth daily. (Patient not taking: Reported on 04/09/2018) 90 tablet 3   No facility-administered medications prior to visit.     ROS: Review of Systems  Constitutional: Negative for activity change, appetite change, chills, fatigue and unexpected weight change.  HENT: Negative for congestion, mouth sores and sinus pressure.   Eyes: Negative for visual disturbance.  Respiratory: Negative for cough and chest tightness.   Gastrointestinal: Negative for abdominal pain and nausea.  Genitourinary: Negative for difficulty urinating, frequency and vaginal pain.  Musculoskeletal: Negative for back pain and gait problem.  Skin: Negative for pallor and rash.  Neurological: Negative for dizziness, tremors, weakness, numbness and headaches.  Psychiatric/Behavioral: Negative for  confusion, sleep disturbance and suicidal ideas.    Objective:  BP 134/78 (BP Location: Left Arm, Patient Position: Sitting, Cuff Size: Normal)   Pulse 80   Temp 98.1 F (36.7 C) (Oral)   Ht 5\' 4"  (1.626 m)   Wt 148 lb (67.1 kg)   SpO2 99%   BMI 25.40 kg/m   BP Readings from Last 3 Encounters:  04/09/18 134/78  10/17/16 140/74  04/25/16 (!) 148/80    Wt Readings from Last 3 Encounters:  04/09/18 148 lb (67.1 kg)  10/17/16 154 lb (69.9 kg)  04/25/16 158 lb (71.7 kg)    Physical Exam  Constitutional: She appears well-developed. No distress.  HENT:  Head: Normocephalic.  Right Ear: External ear normal.  Left Ear: External ear normal.  Nose: Nose normal.  Mouth/Throat: Oropharynx is clear and moist.  Eyes: Pupils are equal, round, and reactive to light. Conjunctivae are normal. Right eye exhibits no discharge. Left eye exhibits no discharge.  Neck: Normal range of motion. Neck supple. No JVD present. No tracheal deviation present. No thyromegaly present.  Cardiovascular: Normal rate, regular rhythm and normal heart sounds.  Pulmonary/Chest: No stridor. No respiratory distress. She has no wheezes.  Abdominal: Soft. Bowel sounds are normal. She exhibits no distension and no mass. There is no tenderness. There is no rebound and no guarding.  Musculoskeletal: She exhibits no edema or tenderness.  Lymphadenopathy:    She has no cervical adenopathy.  Neurological: She displays normal reflexes. No cranial nerve deficit. She exhibits normal muscle tone. Coordination normal.  Skin:  No rash noted. No erythema.  Psychiatric: She has a normal mood and affect. Her behavior is normal. Judgment and thought content normal.    Lab Results  Component Value Date   WBC 8.6 02/16/2015   HGB 13.9 02/16/2015   HCT 40.3 02/16/2015   PLT 267.0 02/16/2015   GLUCOSE 104 (H) 02/16/2015   CHOL 236 (H) 02/16/2015   TRIG 92.0 02/16/2015   HDL 68.40 02/16/2015   LDLDIRECT 143.5 06/15/2008    LDLCALC 149 (H) 02/16/2015   ALT 9 02/16/2015   AST 14 02/16/2015   NA 136 02/16/2015   K 3.1 (L) 02/16/2015   CL 94 (L) 02/16/2015   CREATININE 0.75 02/16/2015   BUN 22 02/16/2015   CO2 30 02/16/2015   TSH 1.10 02/16/2015    No results found.  Assessment & Plan:   There are no diagnoses linked to this encounter.   No orders of the defined types were placed in this encounter.    Follow-up: No follow-ups on file.  Walker Kehr, MD

## 2019-04-05 ENCOUNTER — Other Ambulatory Visit: Payer: Self-pay | Admitting: Internal Medicine

## 2019-05-05 DIAGNOSIS — R030 Elevated blood-pressure reading, without diagnosis of hypertension: Secondary | ICD-10-CM | POA: Diagnosis not present

## 2019-05-05 DIAGNOSIS — Z20828 Contact with and (suspected) exposure to other viral communicable diseases: Secondary | ICD-10-CM | POA: Diagnosis not present

## 2019-05-08 DIAGNOSIS — Z20828 Contact with and (suspected) exposure to other viral communicable diseases: Secondary | ICD-10-CM | POA: Diagnosis not present

## 2019-05-19 ENCOUNTER — Other Ambulatory Visit: Payer: Self-pay | Admitting: Internal Medicine

## 2019-06-27 ENCOUNTER — Other Ambulatory Visit: Payer: Self-pay | Admitting: Internal Medicine

## 2019-07-15 ENCOUNTER — Other Ambulatory Visit: Payer: Self-pay

## 2019-07-15 ENCOUNTER — Ambulatory Visit (INDEPENDENT_AMBULATORY_CARE_PROVIDER_SITE_OTHER): Payer: BC Managed Care – PPO | Admitting: Internal Medicine

## 2019-07-15 ENCOUNTER — Encounter: Payer: Self-pay | Admitting: Internal Medicine

## 2019-07-15 VITALS — BP 134/66 | HR 120 | Temp 98.6°F | Ht 64.0 in | Wt 126.0 lb

## 2019-07-15 DIAGNOSIS — R05 Cough: Secondary | ICD-10-CM | POA: Diagnosis not present

## 2019-07-15 DIAGNOSIS — E559 Vitamin D deficiency, unspecified: Secondary | ICD-10-CM

## 2019-07-15 DIAGNOSIS — R221 Localized swelling, mass and lump, neck: Secondary | ICD-10-CM | POA: Diagnosis not present

## 2019-07-15 DIAGNOSIS — R634 Abnormal weight loss: Secondary | ICD-10-CM | POA: Insufficient documentation

## 2019-07-15 DIAGNOSIS — I10 Essential (primary) hypertension: Secondary | ICD-10-CM

## 2019-07-15 DIAGNOSIS — R059 Cough, unspecified: Secondary | ICD-10-CM

## 2019-07-15 DIAGNOSIS — K047 Periapical abscess without sinus: Secondary | ICD-10-CM

## 2019-07-15 DIAGNOSIS — Z0000 Encounter for general adult medical examination without abnormal findings: Secondary | ICD-10-CM | POA: Diagnosis not present

## 2019-07-15 LAB — LIPID PANEL
Cholesterol: 159 mg/dL (ref 0–200)
HDL: 43.3 mg/dL
LDL Cholesterol: 87 mg/dL (ref 0–99)
NonHDL: 115.79
Total CHOL/HDL Ratio: 4
Triglycerides: 145 mg/dL (ref 0.0–149.0)
VLDL: 29 mg/dL (ref 0.0–40.0)

## 2019-07-15 LAB — CBC WITH DIFFERENTIAL/PLATELET
Basophils Absolute: 0.1 10*3/uL (ref 0.0–0.1)
Basophils Relative: 0.5 % (ref 0.0–3.0)
Eosinophils Absolute: 0.2 10*3/uL (ref 0.0–0.7)
Eosinophils Relative: 1.1 % (ref 0.0–5.0)
HCT: 35.8 % — ABNORMAL LOW (ref 36.0–46.0)
Hemoglobin: 11.8 g/dL — ABNORMAL LOW (ref 12.0–15.0)
Lymphocytes Relative: 21.1 % (ref 12.0–46.0)
Lymphs Abs: 3.3 10*3/uL (ref 0.7–4.0)
MCHC: 33 g/dL (ref 30.0–36.0)
MCV: 90.2 fl (ref 78.0–100.0)
Monocytes Absolute: 1.1 10*3/uL — ABNORMAL HIGH (ref 0.1–1.0)
Monocytes Relative: 7 % (ref 3.0–12.0)
Neutro Abs: 10.9 10*3/uL — ABNORMAL HIGH (ref 1.4–7.7)
Neutrophils Relative %: 70.3 % (ref 43.0–77.0)
Platelets: 358 10*3/uL (ref 150.0–400.0)
RBC: 3.97 Mil/uL (ref 3.87–5.11)
RDW: 13.6 % (ref 11.5–15.5)
WBC: 15.5 10*3/uL — ABNORMAL HIGH (ref 4.0–10.5)

## 2019-07-15 LAB — HEPATIC FUNCTION PANEL
ALT: 10 U/L (ref 0–35)
AST: 12 U/L (ref 0–37)
Albumin: 3.9 g/dL (ref 3.5–5.2)
Alkaline Phosphatase: 112 U/L (ref 39–117)
Bilirubin, Direct: 0.1 mg/dL (ref 0.0–0.3)
Total Bilirubin: 0.5 mg/dL (ref 0.2–1.2)
Total Protein: 7 g/dL (ref 6.0–8.3)

## 2019-07-15 LAB — BASIC METABOLIC PANEL
BUN: 19 mg/dL (ref 6–23)
CO2: 27 mEq/L (ref 19–32)
Calcium: 9.8 mg/dL (ref 8.4–10.5)
Chloride: 100 mEq/L (ref 96–112)
Creatinine, Ser: 1 mg/dL (ref 0.40–1.20)
GFR: 54.65 mL/min — ABNORMAL LOW (ref >60.00)
Glucose, Bld: 160 mg/dL — ABNORMAL HIGH (ref 70–99)
Potassium: 3.4 mEq/L — ABNORMAL LOW (ref 3.5–5.1)
Sodium: 137 mEq/L (ref 135–145)

## 2019-07-15 LAB — SEDIMENTATION RATE: Sed Rate: 74 mm/h — ABNORMAL HIGH (ref 0–30)

## 2019-07-15 LAB — TSH: TSH: 1.08 u[IU]/mL (ref 0.35–4.50)

## 2019-07-15 LAB — VITAMIN D 25 HYDROXY (VIT D DEFICIENCY, FRACTURES): VITD: 46.96 ng/mL (ref 30.00–100.00)

## 2019-07-15 MED ORDER — VITAMIN D3 50 MCG (2000 UT) PO CAPS: 2000.0000 [IU] | ORAL_CAPSULE | Freq: Every day | ORAL | 3 refills | Status: DC

## 2019-07-15 MED ORDER — PROMETHAZINE-CODEINE 6.25-10 MG/5ML PO SYRP: 5.0000 mL | ORAL_SOLUTION | ORAL | 0 refills | Status: DC | PRN

## 2019-07-15 MED ORDER — BENZONATATE 200 MG PO CAPS: 200.0000 mg | ORAL_CAPSULE | Freq: Two times a day (BID) | ORAL | 0 refills | Status: DC | PRN

## 2019-07-15 MED ORDER — OLMESARTAN MEDOXOMIL 20 MG PO TABS: 20.0000 mg | ORAL_TABLET | Freq: Every day | ORAL | 11 refills | Status: DC

## 2019-07-15 NOTE — Assessment & Plan Note (Signed)
Labs Chest, neck CT

## 2019-07-15 NOTE — Assessment & Plan Note (Signed)
Pending root canal

## 2019-07-15 NOTE — Assessment & Plan Note (Signed)
We discussed age appropriate health related issues, including available/recomended screening tests and vaccinations. We discussed a need for adhering to healthy diet and exercise. Labs were ordered to be later reviewed . All questions were answered.   

## 2019-07-15 NOTE — Assessment & Plan Note (Addendum)
Adenopathy cervical and supraclavicular, bilateral ?etiology Neck CT and chest CT  Obtain lab work

## 2019-07-15 NOTE — Assessment & Plan Note (Addendum)
x 3 months CT chest due to chronic cough, weight loss, adenopathy Tessalon as needed Prom-cod syr as needed

## 2019-07-15 NOTE — Progress Notes (Signed)
Subjective:  Patient ID: Jillian Hartman, female    DOB: 1950/08/26  Age: 69 y.o. MRN: [id number]  CC: No chief complaint on file.   HPI Ikea Demicco Rothschild presents for a well exam C/o toothache in Sept 2020 -she was supposed to have a root canal done but never had a chance to do it The patient had a respiratory illness in Oct 2020.  Covid was suspected but not confirmed.  C/o dry cough after this infection in October. No wheezing.  The patient lost 10 lbs.  There is no night sweats, chills, productive cough.  She is complaining of dry cough mostly during the day. C/o neck swelling x 2-3 months.  No pain.  No sore throat.  No trouble swallowing.  No rash.  Outpatient Medications Prior to Visit  Medication Sig Dispense Refill  . cholecalciferol (VITAMIN D) 1000 units tablet Take 1,000 Units by mouth daily.    Marland Kitchen diltiazem (CARDIZEM CD) 120 MG 24 hr capsule TAKE 1 CAPSULE BY MOUTH EVERY DAY 90 capsule 3  . hydrochlorothiazide (HYDRODIURIL) 25 MG tablet TAKE 1 TABLET BY MOUTH EVERY DAY 90 tablet 3  . KLOR-CON M10 10 MEQ tablet TAKE 1 TABLET (10 MEQ TOTAL) BY MOUTH 2 (TWO) TIMES DAILY. 180 tablet 3  . lisinopril (ZESTRIL) 20 MG tablet TAKE 1 TABLET BY MOUTH EVERY DAY 90 tablet 3  . MULTIPLE VITAMIN PO Take by mouth.       No facility-administered medications prior to visit.    ROS: Review of Systems  Constitutional: Negative for activity change, appetite change, chills, fatigue and unexpected weight change.  HENT: Negative for congestion, mouth sores and sinus pressure.   Eyes: Negative for visual disturbance.  Respiratory: Positive for cough. Negative for chest tightness and shortness of breath.   Gastrointestinal: Negative for abdominal pain and nausea.  Genitourinary: Negative for difficulty urinating, frequency and vaginal pain.  Musculoskeletal: Negative for back pain and gait problem.  Skin: Negative for pallor and rash.  Neurological: Negative for dizziness, tremors,  weakness, numbness and headaches.  Psychiatric/Behavioral: Negative for confusion and sleep disturbance.    Objective:  BP 134/66 (BP Location: Left Arm, Patient Position: Sitting, Cuff Size: Normal)   Pulse (!) 120   Temp 98.6 F (37 C) (Oral)   Ht 5\' 4"  (1.626 m)   Wt 126 lb (57.2 kg)   SpO2 98%   BMI 21.63 kg/m   BP Readings from Last 3 Encounters:  07/15/19 134/66  04/09/18 134/78  10/17/16 140/74    Wt Readings from Last 3 Encounters:  07/15/19 126 lb (57.2 kg)  04/09/18 148 lb (67.1 kg)  10/17/16 154 lb (69.9 kg)    Physical Exam Constitutional:      General: She is not in acute distress.    Appearance: She is well-developed.  HENT:     Head: Normocephalic.     Right Ear: External ear normal.     Left Ear: External ear normal.     Nose: Nose normal.  Eyes:     General:        Right eye: No discharge.        Left eye: No discharge.     Conjunctiva/sclera: Conjunctivae normal.     Pupils: Pupils are equal, round, and reactive to light.  Neck:     Thyroid: No thyromegaly.     Vascular: No JVD.     Trachea: No tracheal deviation.  Cardiovascular:     Rate and Rhythm: Normal rate  and regular rhythm.     Heart sounds: Normal heart sounds.  Pulmonary:     Effort: No respiratory distress.     Breath sounds: No stridor. No wheezing.  Abdominal:     General: Bowel sounds are normal. There is no distension.     Palpations: Abdomen is soft. There is no mass.     Tenderness: There is no abdominal tenderness. There is no guarding or rebound.  Musculoskeletal:        General: No tenderness.     Cervical back: Normal range of motion and neck supple.  Lymphadenopathy:     Cervical: No cervical adenopathy.  Skin:    Findings: No erythema or rash.  Neurological:     Cranial Nerves: No cranial nerve deficit.     Motor: No abnormal muscle tone.     Coordination: Coordination normal.     Deep Tendon Reflexes: Reflexes normal.  Psychiatric:        Behavior:  Behavior normal.        Thought Content: Thought content normal.        Judgment: Judgment normal.   neck w/diffuse adenopathy -, proximal, anterior and posterior with fullness in the supraclavicular fossa on both sides.  Nontender. No hepatosplenomegaly No inguinal or axilla adenopathy noted.  Lab Results  Component Value Date   WBC 7.0 04/09/2018   HGB 14.4 04/09/2018   HCT 41.9 04/09/2018   PLT 238.0 04/09/2018   GLUCOSE 116 (H) 04/09/2018   CHOL 221 (H) 04/09/2018   TRIG 81.0 04/09/2018   HDL 68.70 04/09/2018   LDLDIRECT 143.5 06/15/2008   LDLCALC 136 (H) 04/09/2018   ALT 14 04/09/2018   AST 18 04/09/2018   NA 139 04/09/2018   K 3.4 (L) 04/09/2018   CL 100 04/09/2018   CREATININE 0.97 04/09/2018   BUN 15 04/09/2018   CO2 29 04/09/2018   TSH 0.81 04/09/2018    No results found.  Assessment & Plan:    Walker Kehr, MD

## 2019-07-15 NOTE — Assessment & Plan Note (Signed)
D/c ACE due to cough Start Olmesartan

## 2019-07-16 LAB — URINALYSIS, ROUTINE W REFLEX MICROSCOPIC
Bilirubin Urine: NEGATIVE
Nitrite: NEGATIVE
Specific Gravity, Urine: >=1.03 — AB (ref 1.000–1.030)
Urine Glucose: NEGATIVE
Urobilinogen, UA: 0.2 (ref 0.0–1.0)
pH: 5.5 (ref 5.0–8.0)

## 2019-07-17 ENCOUNTER — Ambulatory Visit (INDEPENDENT_AMBULATORY_CARE_PROVIDER_SITE_OTHER)
Admission: RE | Admit: 2019-07-17 | Discharge: 2019-07-17 | Disposition: A | Discharge status: Home / Self Care | Payer: BC Managed Care – PPO | Source: Ambulatory Visit | Attending: Internal Medicine | Admitting: Internal Medicine

## 2019-07-17 ENCOUNTER — Other Ambulatory Visit: Payer: Self-pay

## 2019-07-17 DIAGNOSIS — R221 Localized swelling, mass and lump, neck: Secondary | ICD-10-CM

## 2019-07-17 DIAGNOSIS — R634 Abnormal weight loss: Secondary | ICD-10-CM

## 2019-07-17 DIAGNOSIS — R05 Cough: Secondary | ICD-10-CM

## 2019-07-17 DIAGNOSIS — R590 Localized enlarged lymph nodes: Secondary | ICD-10-CM | POA: Diagnosis not present

## 2019-07-17 DIAGNOSIS — E049 Nontoxic goiter, unspecified: Secondary | ICD-10-CM | POA: Diagnosis not present

## 2019-07-17 DIAGNOSIS — R059 Cough, unspecified: Secondary | ICD-10-CM

## 2019-07-17 DIAGNOSIS — R918 Other nonspecific abnormal finding of lung field: Secondary | ICD-10-CM | POA: Diagnosis not present

## 2019-07-17 MED ORDER — IOHEXOL 300 MG/ML  SOLN
80.0000 mL | Freq: Once | INTRAMUSCULAR | Status: AC | PRN
  Administered 2019-07-17: 15:00:00 80 mL via INTRAVENOUS

## 2019-07-20 ENCOUNTER — Other Ambulatory Visit: Payer: Self-pay | Admitting: Internal Medicine

## 2019-07-20 DIAGNOSIS — R221 Localized swelling, mass and lump, neck: Secondary | ICD-10-CM

## 2019-07-20 DIAGNOSIS — R599 Enlarged lymph nodes, unspecified: Secondary | ICD-10-CM

## 2019-07-20 DIAGNOSIS — R591 Generalized enlarged lymph nodes: Secondary | ICD-10-CM

## 2019-07-21 ENCOUNTER — Encounter (HOSPITAL_COMMUNITY): Payer: Self-pay

## 2019-07-21 NOTE — Progress Notes (Signed)
Bea Laura. Seiler Female, 69 y.o., 12/17/1950 MRN:  [id number] Phone:  [phone] Jerilynn Mages) PCP:  Cassandria Anger, MD Coverage:  Sherre Poot [employer] Ppo  RE: Biopsy Received: Yesterday Message Contents  Markus Daft, MD  Ernestene Mention for US guided cervical lymph node biopsy.   [place]   Previous Messages  ----- Message -----  From: Lenore Cordia  Sent: 07/20/2019  4:30 PM EST  To: Ir Procedure Requests  Subject: Biopsy                      Procedure Requested: [place] Core Biopsy (Lymph Nodes)    Reason for Procedure: Adenopathy - neck and chest. R/o cancer. Thx    Provider Requesting: Cassandria Anger  Provider Telephone: [phone]    Other Info: RAd exams in Epic

## 2019-08-01 ENCOUNTER — Ambulatory Visit: Payer: BC Managed Care – PPO | Attending: Internal Medicine

## 2019-08-01 DIAGNOSIS — Z23 Encounter for immunization: Secondary | ICD-10-CM | POA: Insufficient documentation

## 2019-08-01 NOTE — Progress Notes (Signed)
   Covid-19 Vaccination Clinic  Name:  Jillian Hartman    MRN: [id number] DOB: 12/29/1950  08/01/2019  [name] was observed post Covid-19 immunization for 15 minutes without incidence. She was provided with Vaccine Information Sheet and instruction to access the V-Safe system.   [name] was instructed to call 911 with any severe reactions post vaccine: Marland Kitchen Difficulty breathing  . Swelling of your face and throat  . A fast heartbeat  . A bad rash all over your body  . Dizziness and weakness    Immunizations Administered    Name Date Dose VIS Date Route   Moderna COVID-19 Vaccine 08/01/2019  2:12 PM 0.5 mL 05/05/2019 Intramuscular   Manufacturer: Moderna   Lot: [id number]   [place]: [id number]

## 2019-08-18 ENCOUNTER — Other Ambulatory Visit: Payer: Self-pay | Admitting: Physician Assistant

## 2019-08-19 ENCOUNTER — Ambulatory Visit (HOSPITAL_COMMUNITY)
Admission: RE | Admit: 2019-08-19 | Discharge: 2019-08-19 | Disposition: A | Discharge status: Home / Self Care | Payer: BC Managed Care – PPO | Source: Ambulatory Visit | Attending: Internal Medicine | Admitting: Internal Medicine

## 2019-08-19 ENCOUNTER — Other Ambulatory Visit: Payer: Self-pay

## 2019-08-19 DIAGNOSIS — C969 Malignant neoplasm of lymphoid, hematopoietic and related tissue, unspecified: Secondary | ICD-10-CM | POA: Insufficient documentation

## 2019-08-19 DIAGNOSIS — R591 Generalized enlarged lymph nodes: Secondary | ICD-10-CM | POA: Diagnosis not present

## 2019-08-19 DIAGNOSIS — Z79899 Other long term (current) drug therapy: Secondary | ICD-10-CM | POA: Insufficient documentation

## 2019-08-19 DIAGNOSIS — E785 Hyperlipidemia, unspecified: Secondary | ICD-10-CM | POA: Diagnosis not present

## 2019-08-19 DIAGNOSIS — R590 Localized enlarged lymph nodes: Secondary | ICD-10-CM | POA: Diagnosis not present

## 2019-08-19 DIAGNOSIS — I10 Essential (primary) hypertension: Secondary | ICD-10-CM | POA: Diagnosis not present

## 2019-08-19 DIAGNOSIS — R221 Localized swelling, mass and lump, neck: Secondary | ICD-10-CM

## 2019-08-19 DIAGNOSIS — R599 Enlarged lymph nodes, unspecified: Secondary | ICD-10-CM

## 2019-08-19 DIAGNOSIS — C801 Malignant (primary) neoplasm, unspecified: Secondary | ICD-10-CM | POA: Diagnosis not present

## 2019-08-19 DIAGNOSIS — C770 Secondary and unspecified malignant neoplasm of lymph nodes of head, face and neck: Secondary | ICD-10-CM | POA: Diagnosis not present

## 2019-08-19 MED ORDER — FENTANYL CITRATE (PF) 100 MCG/2ML IJ SOLN
INTRAMUSCULAR | Status: AC | PRN
  Administered 2019-08-19 (×2): 25 ug via INTRAVENOUS

## 2019-08-19 MED ORDER — MIDAZOLAM HCL 2 MG/2ML IJ SOLN
INTRAMUSCULAR | Status: AC
  Filled 2019-08-19: qty 2

## 2019-08-19 MED ORDER — LIDOCAINE HCL (PF) 1 % IJ SOLN
INTRAMUSCULAR | Status: AC
  Filled 2019-08-19: qty 30

## 2019-08-19 MED ORDER — FENTANYL CITRATE (PF) 100 MCG/2ML IJ SOLN
INTRAMUSCULAR | Status: AC
  Filled 2019-08-19: qty 2

## 2019-08-19 MED ORDER — MIDAZOLAM HCL 2 MG/2ML IJ SOLN
INTRAMUSCULAR | Status: AC | PRN
  Administered 2019-08-19 (×2): 0.5 mg via INTRAVENOUS

## 2019-08-19 MED ORDER — SODIUM CHLORIDE 0.9 % IV SOLN: INTRAVENOUS | Status: DC

## 2019-08-19 NOTE — Procedures (Signed)
Interventional Radiology Procedure:   Indications: Lymphadenopathy  Procedure: US guided core biopsy of a right cervical lymph node  Findings: Numerous enlarged cervical nodes, 6 cores from right supraclavicular node.  Complications: None     EBL: less than 10 ml  Plan: Discharge to home   [place]. Anselm Pancoast, MD  Pager: [phone]

## 2019-08-19 NOTE — Progress Notes (Signed)
Ambulated in hallway and to the bathroom to void tol well.  

## 2019-08-19 NOTE — H&P (Signed)
Chief Complaint: Patient was seen in consultation today for cervical lymph node biopsy.  Referring Physician(s): Plotnikov,Aleksei V  Supervising Physician: Markus Daft  Patient Status: [facility] - Out-pt  History of Present Illness: Jillian Hartman is a 69 y.o. female with a past medical history significant for HTN and HLD who presents today for a lymph node biopsy. Ms. Jillian Hartman was seen by her PCP in February of this with complaints of toothache, neck swelling and persistent dry cough. Her ACE inhibitor was stopped, however the dry cough persisted. She underwent a CT chest, soft tissue, neck with contrast on 07/17/19 which noted a prominently enlarged thyroid gland extending into the superior mediastinum and suprasternal notch with heterogeneous enhancement and coarse calcification, concerning for malignancy. IR has been consulted for an image guided cervical lymph node biopsy to further guide management.  Ms. [name] states she has been feeling well, her cough has improved but still occasionally occurs. She denies any pain or dyspnea. She has been eating and drinking well at home. She is anxious about the procedure and would like to have some sedation medicine during the procedure. She is agreeable to proceed as planned.  Past Medical History:  Diagnosis Date  . Borderline hyperlipidemia   . Hypertension   . Tonsillitis 12/29/2011  . Varicose vein     Past Surgical History:  Procedure Laterality Date  . ABDOMINAL HYSTERECTOMY    . CERVICAL CONIZATION W/BX     '77 after abnormal pap  . LAPAROSCOPY     '82 fertility work up    Allergies: Patient has no known allergies.  Medications: Prior to Admission medications   Medication Sig Start Date End Date Taking? Authorizing Provider  benzonatate (TESSALON) 200 MG capsule Take 1 capsule (200 mg total) by mouth 2 (two) times daily as needed for cough. 07/15/19  Yes Plotnikov, [name], MD  Cholecalciferol (VITAMIN D3) 50 MCG (2000 UT)  capsule Take 1 capsule (2,000 Units total) by mouth daily. 07/15/19  Yes Plotnikov, [name], MD  diltiazem (CARDIZEM CD) 120 MG 24 hr capsule TAKE 1 CAPSULE BY MOUTH EVERY DAY Patient taking differently: Take 120 mg by mouth daily.  06/27/19  Yes Plotnikov, [name], MD  hydrochlorothiazide (HYDRODIURIL) 25 MG tablet TAKE 1 TABLET BY MOUTH EVERY DAY Patient taking differently: Take 25 mg by mouth daily.  04/06/19  Yes Plotnikov, [name], MD  KLOR-CON M10 10 MEQ tablet TAKE 1 TABLET (10 MEQ TOTAL) BY MOUTH 2 (TWO) TIMES DAILY. Patient taking differently: Take 10 mEq by mouth 2 (two) times daily.  04/06/19  Yes Plotnikov, [name], MD  MULTIPLE VITAMIN PO Take 1 tablet by mouth daily.    Yes [provider]  olmesartan (BENICAR) 20 MG tablet Take 1 tablet (20 mg total) by mouth daily. 07/15/19 07/14/20 Yes Plotnikov, [name], MD  promethazine-codeine (PHENERGAN WITH CODEINE) 6.25-10 MG/5ML syrup Take 5 mLs by mouth every 4 (four) hours as needed. Patient taking differently: Take 5 mLs by mouth at bedtime as needed for cough.  07/15/19  Yes Plotnikov, [name], MD     Family History  Problem Relation Age of Onset  . Hypertension Mother   . Hypertension Father   . Ulcers Father   . Cancer Father        oral cancer    Social History   Socioeconomic History  . Marital status: Divorced    Spouse name: Not on file  . Number of children: 2  . Years of education:  16  . Highest education level: Not on file  Occupational History  . 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 Smoking status: Never Smoker  . Smokeless tobacco: Never Used  Substance and Sexual Activity  . Alcohol use: Not on file  . Drug use: Not on file  . Sexual activity: Not on file  Other Topics Concern  . Not on file  Social History Narrative   Nurse, [employer] in [place]. Married 42- divorced [phone] son 66- 1 daughter died in MVA 9th grade. Monogamous relationship since 1998. 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               Social Determinants of Health   Financial Resource Strain:   . Difficulty of Paying Living Expenses:   Food Insecurity:   . Worried About Charity fundraiser in the Last Year:   . Arboriculturist in the Last Year:   Transportation Needs:   . Film/video editor (Medical):   Marland Kitchen Lack of Transportation (Non-Medical):   Physical Activity:   . Days of Exercise per Week:   . Minutes of Exercise per Session:   Stress:   . Feeling of Stress :   Social Connections:   . Frequency of Communication with Friends and Family:   . Frequency of Social Gatherings with Friends and Family:   . Attends Religious Services:   . Active Member of Clubs or Organizations:   . Attends Archivist Meetings:   Marland Kitchen Marital Status:      Review of Systems: A 12 point ROS discussed and pertinent positives are indicated in the HPI above.  All other systems are negative.  Review of Systems  Constitutional: Negative for chills and fever.  Respiratory: Negative for cough and shortness of breath.   Cardiovascular: Negative for chest pain.  Gastrointestinal: Negative for abdominal pain, blood in stool, diarrhea, nausea and vomiting.  Genitourinary: Negative for hematuria.  Musculoskeletal: Negative for back pain.  Neurological: Negative for dizziness and headaches.    Vital Signs: BP 118/68   Pulse (!) 106   Temp 97.7 F (36.5 C) (Skin)   Resp 15   Ht 5\' 4"  (1.626 m)   Wt 125 lb (56.7 kg)   SpO2 100%   BMI 21.46 kg/m   Physical Exam Vitals reviewed.  Constitutional:      General: She is not in acute distress. HENT:     Head: Normocephalic.     Mouth/Throat:     Mouth: Mucous membranes are moist.     Pharynx: Oropharynx is clear. No oropharyngeal exudate or posterior oropharyngeal erythema.  Cardiovascular:     Rate and Rhythm: Normal rate and regular rhythm.  Pulmonary:     Effort: Pulmonary effort is normal.     Breath sounds: Normal breath  sounds.  Abdominal:     General: There is no distension.     Palpations: Abdomen is soft.     Tenderness: There is no abdominal tenderness.  Skin:    General: Skin is warm and dry.  Neurological:     Mental Status: She is alert and oriented to person, place, and time.  Psychiatric:        Mood and Affect: Mood normal.        Behavior: Behavior normal.        Thought Content: Thought content normal.        Judgment: Judgment normal.      MD Evaluation Airway:  WNL Heart: WNL Abdomen: WNL Chest/ Lungs: WNL ASA  Classification: 2 Mallampati/Airway Score: One   Imaging: No results found.  Labs:  CBC: Recent Labs    07/15/19 1543  WBC 15.5*  HGB 11.8*  HCT 35.8*  PLT 358.0    COAGS: No results for input(s): INR, APTT in the last 8760 hours.  BMP: Recent Labs    07/15/19 1543  NA 137  K 3.4*  CL 100  CO2 27  GLUCOSE 160*  BUN 19  CALCIUM 9.8  CREATININE 1.00    LIVER FUNCTION TESTS: Recent Labs    07/15/19 1543  BILITOT 0.5  AST 12  ALT 10  ALKPHOS 112  PROT 7.0  ALBUMIN 3.9    TUMOR MARKERS: No results for input(s): AFPTM, CEA, CA199, CHROMGRNA in the last 8760 hours.  Assessment and Plan:  69 y/o F with recent history of neck swelling and dry cough found to have significant cervical lymphadenopathy on CT concerning for malignancy who presents today for an image guided lymph node biopsy. Patient history and imaging has been reviewed by Dr. Anselm Pancoast who approves procedure.  Patient has been NPO since 7 am, she does not take blood thinning medications.  Risks and benefits of cervical lymph node biopsy was discussed with the patient and/or patient's family including, but not limited to bleeding, infection, damage to adjacent structures or low yield requiring additional tests.  All of the questions were answered and there is agreement to proceed.  Consent signed and in chart.  Thank you for this interesting consult.  I greatly enjoyed meeting  Lavonne Kinderman Tant and look forward to participating in their care.  A copy of this report was sent to the requesting provider on this date.  Electronically Signed: Joaquim Nam, PA-C 08/19/2019, 11:52 AM   I spent a total of  30 Minutes   in face to face in clinical consultation, greater than 50% of which was counseling/coordinating care for cervical lymph node biopsy.

## 2019-08-19 NOTE — Discharge Instructions (Signed)

## 2019-08-19 NOTE — Progress Notes (Signed)
Discharge instructions reviewed with pt and her son (via telephone) both voice understanding.

## 2019-08-21 ENCOUNTER — Other Ambulatory Visit: Payer: Self-pay | Admitting: Internal Medicine

## 2019-08-21 ENCOUNTER — Telehealth: Payer: Self-pay

## 2019-08-21 DIAGNOSIS — C799 Secondary malignant neoplasm of unspecified site: Secondary | ICD-10-CM

## 2019-08-21 DIAGNOSIS — IMO0002 Reserved for concepts with insufficient information to code with codable children: Secondary | ICD-10-CM

## 2019-08-21 LAB — SURGICAL PATHOLOGY

## 2019-08-21 NOTE — Telephone Encounter (Signed)
Suanne Marker with [facility]ology calling and states that Dr Vic Ripper would like to speak with Dr Alain Marion or assistant regarding some pathology results. Please advise.  CB#: [phone]

## 2019-08-21 NOTE — Telephone Encounter (Signed)
Spoke with Dr Vic Ripper and received results with are in pts chart

## 2019-08-24 ENCOUNTER — Telehealth: Payer: Self-pay | Admitting: Internal Medicine

## 2019-08-24 ENCOUNTER — Telehealth: Payer: Self-pay | Admitting: Hematology

## 2019-08-24 DIAGNOSIS — C7800 Secondary malignant neoplasm of unspecified lung: Secondary | ICD-10-CM

## 2019-08-24 DIAGNOSIS — R221 Localized swelling, mass and lump, neck: Secondary | ICD-10-CM

## 2019-08-24 NOTE — Telephone Encounter (Signed)
Received a message from [place]:  [place], Lori D  Cc: Leota Sauers, RN; Malmfelt, Stephani Police, RN  Good morning Cecille Rubin!   Our head and neck navigator would like for Dr. Alain Marion to order a PET scan. Ms. Hove has an appt scheduled on 3/24 at 1pm.

## 2019-08-24 NOTE — Telephone Encounter (Signed)
Ok Pls inform the pt Thx

## 2019-08-24 NOTE — Telephone Encounter (Signed)
Received a new pate referral from Dr. Lanice Schwab for Squamous cell carcinoma. Pt has been scheduled to see Dr. Maylon Peppers on 3/24 at 1pm w/labs at 1230pm. I cld and lft the appt date and time on the pt's voicemail.

## 2019-08-25 ENCOUNTER — Other Ambulatory Visit: Payer: Self-pay | Admitting: *Deleted

## 2019-08-25 ENCOUNTER — Telehealth: Payer: Self-pay | Admitting: *Deleted

## 2019-08-25 ENCOUNTER — Other Ambulatory Visit: Payer: Self-pay | Admitting: Hematology

## 2019-08-25 DIAGNOSIS — C760 Malignant neoplasm of head, face and neck: Secondary | ICD-10-CM

## 2019-08-25 DIAGNOSIS — C7989 Secondary malignant neoplasm of other specified sites: Secondary | ICD-10-CM | POA: Insufficient documentation

## 2019-08-25 NOTE — Telephone Encounter (Signed)
How do I do it? Thanks,

## 2019-08-25 NOTE — Telephone Encounter (Signed)
Img [id number]

## 2019-08-25 NOTE — Telephone Encounter (Signed)
Oncology Nurse Navigator Documentation  Ms. Jillian Hartman returned my call from earlier today.   Introduced myself, explained my role as a member of her Care Team. Confirmed her understanding of Jillian Hartman location, tomorrow's appts, registration procedure. She voiced understanding I will join her tomorrow when she meets with Dr. Maylon Peppers, provide additional information.  Jillian Orem, RN, BSN Head & Neck Oncology Nurse [facility] [place] [phone]

## 2019-08-25 NOTE — Telephone Encounter (Signed)
Can you please change order to "Initial" skull base to thigh  thanks

## 2019-08-25 NOTE — Progress Notes (Signed)
[facility] CONSULT NOTE  Patient Care Team: Plotnikov, [name], MD as PCP - General (Internal Medicine)  HEME/ONC OVERVIEW: 1. Metastatic squamous cell carcinoma, possibly head/neck vs lung primary malignancy -08/2019:   Diffuse lymphadenopathy within the neck, axilla, thorax and upper abdomen, numerous bilateral pulmonary nodules; prominent thyroid gland extending into mediastinum on CT  R supraclavicular LN bx, squamous cell carcinoma  ASSESSMENT & PLAN:   Metastatic squamous cell carcinoma, possibly head/neck vs lung primary malignancy -I reviewed the patient's records in detail, including PCP clinic notes, lab studies, imaging results, and the pathology reports -I also independently reviewed the radiologic images of recent CT neck and chest, and agree with findings documented -In summary, patient presented to her PCP in 07/2019 for wellness exam, and reported neck swelling for 2 to 3 months, weight loss of 10 lbs over several month, without any associated dysphagia or odynophagia.  CT neck and chest in 07/2019 showed diffuse lymphadenopathy within the neck, axilla, thorax and upper abdomen, as well as numerous bilateral nodules, concerning for metastatic disease.  There was also a prominent thyroid gland extending into the mediastinum.  She underwent ultrasound-guided biopsy in mid-08/2019, which showed squamous cell carcinoma.  Patient was referred to oncology for further evaluation.  - I reviewed imaging and the pathology results in detail with the patient, as well as the NCCN guideline  -Given extensive cervical lymphadenopathy, this may suggest a head/neck primary malignancy.  Another possible primary site would be the lung.  CT neck did not show any definite lesion in the oral cavity, oropharynx, or larynx.  While the thyroid gland was prominent, the pathology of squamous cell carcinoma suggested that the malignancy did not originate from the thyroid gland. -I have  ordered PET scan to complete staging study -In addition, I have requested molecular studies and PD-L1 on the supraclavicular LN biopsy -Finally, I have referred the patient to ENT for urgent evaluation for any H&N primary malignancy  -We also briefly discussed the role of port.  Pending the molecular studies, we will determine if port placement is needed.   Normocytic anemia -Possibly due to anemia of chronic disease -Hgb 11.4 today, stable -Patient denies any symptoms of bleeding -I have ordered iron profile for further evaluation  Leukocytosis -WBC 20.3k today, stable; possibly reactive in the setting of malignancy -Patient denies any symptoms of infection -We will monitor it for now  Orders Placed This Encounter  Procedures  . CBC with Differential (Cancer Center Only)    Standing Status:   Future    Standing Expiration Date:   09/29/2020  . CMP ([facility] only)    Standing Status:   Future    Standing Expiration Date:   09/29/2020  . Ambulatory referral to ENT    Referral Priority:   Urgent    Referral Type:   Consultation    Referral Reason:   Specialty Services Required    Requested Specialty:   Otolaryngology    Number of Visits Requested:   1   The total time spent in the encounter was 70 minutes, including face-to-face time with the patient, review of various tests results, order additional studies/medications, documentation, and coordination of care plan.   All questions were answered. The patient knows to call the clinic with any problems, questions or concerns. No barriers to learning was detected.  Return in 3 weeks for labs and clinic follow-up.  Jillian Men, MD 3/24/20211:58 PM  CHIEF COMPLAINTS/PURPOSE OF CONSULTATION:  "I am doing  fine so far"  HISTORY OF PRESENTING ILLNESS:  Jillian Hartman 69 y.o. female is here because of newly diagnosed squamous cell carcinoma of the right supraclavicular LN. patient reports that she was diagnosed with a dental  infection in 10/2018, associated with mild neck swelling, for which she was prescribed a course of antibiotics by her dentist with improvement.  Starting around Thanksgiving, she began to have dry cough, mild exertional shortness of breath, and fatigue.  She had Covid testing, which was negative.  Over the next a few months, her shortness of breath and fatigue improved, but she had lost approximately 15 lbs.  She patient presented to her PCP in 07/2019 for annual wellness exam, and her PCP ordered CT neck/chest for the history of intermittent neck swelling, which showed diffuse lymphadenopathy within the neck, axilla, thorax and upper abdomen, as well as numerous bilateral nodules, concerning for metastatic disease.  There was also a prominent thyroid gland extending into the mediastinum.  She underwent ultrasound-guided biopsy in mid-08/2019, which showed squamous cell carcinoma.  Patient was referred to oncology for further evaluation.   Patient reports that she still has intermittent dry cough, but her shortness of breath is improving.  She denies any dysphagia or odynophagia.  She denies any constitutional symptoms.  She denies any history of tobacco or illicit drug use.  She works as a Forensic psychologist.  REVIEW OF SYSTEMS:   Constitutional: ( - ) fevers, ( - )  chills , ( - ) night sweats Eyes: ( - ) blurriness of vision, ( - ) double vision, ( - ) watery eyes Ears, nose, mouth, throat, and face: ( - ) mucositis, ( - ) sore throat Respiratory: ( + ) cough, ( + ) mild exertional dyspnea, ( - ) wheezes Cardiovascular: ( - ) palpitation, ( - ) chest discomfort, ( - ) lower extremity swelling Gastrointestinal:  ( - ) nausea, ( - ) heartburn, ( - ) change in bowel habits Skin: ( - ) abnormal skin rashes Lymphatics: ( - ) new lymphadenopathy, ( - ) easy bruising Neurological: ( - ) numbness, ( - ) tingling, ( - ) new weaknesses Behavioral/Psych: ( - ) mood change, ( - ) new changes  All other systems  were reviewed with the patient and are negative.  I have reviewed her chart and materials related to her cancer extensively and collaborated history with the patient. Summary of oncologic history is as follows: Oncology History  Squamous cell carcinoma of head and neck ([place])  07/17/2019 Imaging   CT neck: IMPRESSION: 1. Prominently enlarged thyroid gland extending into the superior mediastinum and suprasternal notch with heterogeneous enhancement and coarse calcifications in the setting of prominent cervical and mediastinal lymphadenopathy and lung nodules is concerning for malignancy. 2. Advanced cervical degenerative changes, with moderate to severe right neural foraminal narrowing at C3-4. 3. Chronic inflammatory changes of the right maxillary sinus.   07/17/2019 Imaging   CT chest: IMPRESSION: 1. Diffuse lymphadenopathy within the neck, axillary regions, mediastinum, hila, and upper abdomen. Differential would include metastatic disease versus lymphoma/leukemia. 2. Numerous bilateral pulmonary nodules consistent with metastatic disease. 3. Heterogeneous enlarged thyroid, nonspecific. Neoplasm not excluded. 4. Indeterminate hyperdensities within the splenic parenchyma, metastatic disease or lymphomatous involvement suspected.   08/19/2019 Pathology Results   FINAL MICROSCOPIC DIAGNOSIS:   A. LYMPH NODE, RIGHT SUPRACLAVICULAR, NEEDLE CORE BIOPSY:  - Squamous cell carcinoma, see comment  - Lymphoid tissue is not identified    COMMENT:   Immunohistochemical  stains show that the tumor cells are positive for CK7, p63 and CK 5/6; and negative for CK20, TTF-1 and SOX-10, consistent  with above diagnosis.  Findings may suggest a lymph node entirely  replaced by tumor.  Dr. Tresa Moore reviewed the case and concurs with the diagnosis.  Dr. Alain Marion was paged on 08/21/2019.    08/25/2019 Initial Diagnosis   Squamous cell carcinoma of head and neck [facility])     MEDICAL HISTORY:  Past  Medical History:  Diagnosis Date  . Borderline hyperlipidemia   . Hypertension   . Tonsillitis 12/29/2011  . Varicose vein     SURGICAL HISTORY: Past Surgical History:  Procedure Laterality Date  . ABDOMINAL HYSTERECTOMY    . CERVICAL CONIZATION W/BX     '77 after abnormal pap  . LAPAROSCOPY     '82 fertility work up    SOCIAL HISTORY: Social History   Socioeconomic History  . Marital status: Divorced    Spouse name: Not on file  . Number of children: 2  . Years of education: 62  . Highest education level: Not on file  Occupational History  . 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 Smoking status: Never Smoker  . Smokeless tobacco: Never Used  Substance and Sexual Activity  . Alcohol use: Not on file  . Drug use: Not on file  . Sexual activity: Not on file  Other Topics Concern  . Not on file  Social History Narrative   Nurse, [employer] in [place]. Married 68- divorced [phone] son 24- 1 daughter died in MVA 9th grade. Monogamous relationship since 1998. 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               Social Determinants of Health   Financial Resource Strain:   . Difficulty of Paying Living Expenses:   Food Insecurity:   . Worried About Charity fundraiser in the Last Year:   . Arboriculturist in the Last Year:   Transportation Needs:   . Film/video editor (Medical):   Marland Kitchen Lack of Transportation (Non-Medical):   Physical Activity:   . Days of Exercise per Week:   . Minutes of Exercise per Session:   Stress:   . Feeling of Stress :   Social Connections:   . Frequency of Communication with Friends and Family:   . Frequency of Social Gatherings with Friends and Family:   . Attends Religious Services:   . Active Member of Clubs or Organizations:   . Attends Archivist Meetings:   Marland Kitchen Marital Status:   Intimate Partner Violence:   . Fear of Current or Ex-Partner:   . Emotionally Abused:   Marland Kitchen Physically Abused:   . Sexually  Abused:     FAMILY HISTORY: Family History  Problem Relation Age of Onset  . Hypertension Mother   . Hypertension Father   . Ulcers Father   . Cancer Father        oral cancer    ALLERGIES:  has No Known Allergies.  MEDICATIONS:  Current Outpatient Medications  Medication Sig Dispense Refill  . benzonatate (TESSALON) 200 MG capsule Take 1 capsule (200 mg total) by mouth 2 (two) times daily as needed for cough. 20 capsule 0  . Cholecalciferol (VITAMIN D3) 50 MCG (2000 UT) capsule Take 1 capsule (2,000 Units total) by mouth daily. 100 capsule 3  . diltiazem (CARDIZEM CD) 120 MG 24 hr capsule TAKE 1 CAPSULE BY MOUTH EVERY DAY (  Patient taking differently: Take 120 mg by mouth daily. ) 90 capsule 3  . hydrochlorothiazide (HYDRODIURIL) 25 MG tablet TAKE 1 TABLET BY MOUTH EVERY DAY (Patient taking differently: Take 25 mg by mouth daily. ) 90 tablet 3  . KLOR-CON M10 10 MEQ tablet TAKE 1 TABLET (10 MEQ TOTAL) BY MOUTH 2 (TWO) TIMES DAILY. (Patient taking differently: Take 10 mEq by mouth 2 (two) times daily. ) 180 tablet 3  . MULTIPLE VITAMIN PO Take 1 tablet by mouth daily.     Marland Kitchen olmesartan (BENICAR) 20 MG tablet Take 1 tablet (20 mg total) by mouth daily. 30 tablet 11  . promethazine-codeine (PHENERGAN WITH CODEINE) 6.25-10 MG/5ML syrup Take 5 mLs by mouth every 4 (four) hours as needed. (Patient taking differently: Take 5 mLs by mouth at bedtime as needed for cough. ) 300 mL 0   No current facility-administered medications for this visit.    PHYSICAL EXAMINATION: ECOG PERFORMANCE STATUS: 0 - Asymptomatic  Vitals:   08/26/19 1259  BP: 140/66  Pulse: (!) 106  Resp: 18  Temp: 98.5 F (36.9 C)  SpO2: 100%   Filed Weights   08/26/19 1259  Weight: 118 lb 9.6 oz (53.8 kg)    GENERAL: alert, no distress and comfortable SKIN: skin color, texture, turgor are normal, no rashes or significant lesions EYES: conjunctiva are pink and non-injected, sclera clear OROPHARYNX: no exudate, no  erythema; lips, buccal mucosa, and tongue normal  NECK: supple, non-tender LYMPH:  Shotty bilateral cervical adenopathy and supraclavicular LN's  LUNGS: clear to auscultation with normal breathing effort HEART: regular rate & rhythm, no murmurs, no lower extremity edema ABDOMEN: soft, non-tender, non-distended, normal bowel sounds Musculoskeletal: no cyanosis of digits and no clubbing  PSYCH: alert & oriented x 3, fluent speech  LABORATORY DATA:  I have reviewed the data as listed Lab Results  Component Value Date   WBC 20.3 (H) 08/26/2019   HGB 11.4 (L) 08/26/2019   HCT 35.3 (L) 08/26/2019   MCV 89.4 08/26/2019   PLT 380 08/26/2019   Lab Results  Component Value Date   NA 141 08/26/2019   K 3.8 08/26/2019   CL 104 08/26/2019   CO2 24 08/26/2019    RADIOGRAPHIC STUDIES: I have personally reviewed the radiological images as listed and agreed with the findings in the report. [place] CORE BIOPSY (LYMPH NODES)  Result Date: 08/19/2019 INDICATION: 69 year old with chest and neck lymphadenopathy. Pulmonary nodules. Tissue diagnosis is needed. EXAM: ULTRASOUND-GUIDED RIGHT CERVICAL LYMPH NODE BIOPSY MEDICATIONS: None. ANESTHESIA/SEDATION: Moderate (conscious) sedation was employed during this procedure. A total of Versed 1.0 mg and Fentanyl 50 mcg was administered intravenously. Moderate Sedation Time: 13 minutes. The patient's level of consciousness and vital signs were monitored continuously by radiology nursing throughout the procedure under my direct supervision. FLUOROSCOPY TIME:  None COMPLICATIONS: None immediate. PROCEDURE: Informed written consent was obtained from the patient after a thorough discussion of the procedural risks, benefits and alternatives. All questions were addressed. A timeout was performed prior to the initiation of the procedure. Both sides of the neck were evaluated with ultrasound. A right supraclavicular lymph node was identified and targeted for biopsy. Skin was  prepped with chlorhexidine and sterile field was created. Skin and soft tissues were anesthetized with 1% lidocaine. Using ultrasound guidance, 18 gauge core needle was directed into the right cervical lymph node. Total of 6 core biopsies were obtained. Specimens placed in saline. Bandage placed over the puncture site. FINDINGS: Numerous hypoechoic enlarged lymph nodes on  both sides of the neck. A right supraclavicular lymph node was biopsied. Needle position confirmed within the lymph node. IMPRESSION: Ultrasound-guided core biopsy of a right cervical lymph node. Electronically Signed   By: Markus Daft M.D.   On: 08/19/2019 16:14    PATHOLOGY: I have reviewed the pathology reports as documented in the oncologist history.

## 2019-08-26 ENCOUNTER — Other Ambulatory Visit: Payer: Self-pay

## 2019-08-26 ENCOUNTER — Encounter: Payer: Self-pay | Admitting: Hematology

## 2019-08-26 ENCOUNTER — Encounter: Payer: Self-pay | Admitting: *Deleted

## 2019-08-26 ENCOUNTER — Telehealth: Payer: Self-pay | Admitting: *Deleted

## 2019-08-26 ENCOUNTER — Inpatient Hospital Stay: Payer: BC Managed Care – PPO

## 2019-08-26 ENCOUNTER — Inpatient Hospital Stay: Payer: BC Managed Care – PPO | Attending: Hematology | Admitting: Hematology

## 2019-08-26 VITALS — BP 140/66 | HR 106 | Temp 98.5°F | Resp 18 | Ht 64.0 in | Wt 118.6 lb

## 2019-08-26 DIAGNOSIS — D72829 Elevated white blood cell count, unspecified: Secondary | ICD-10-CM | POA: Insufficient documentation

## 2019-08-26 DIAGNOSIS — I10 Essential (primary) hypertension: Secondary | ICD-10-CM | POA: Diagnosis not present

## 2019-08-26 DIAGNOSIS — E785 Hyperlipidemia, unspecified: Secondary | ICD-10-CM | POA: Diagnosis not present

## 2019-08-26 DIAGNOSIS — R590 Localized enlarged lymph nodes: Secondary | ICD-10-CM | POA: Diagnosis not present

## 2019-08-26 DIAGNOSIS — E049 Nontoxic goiter, unspecified: Secondary | ICD-10-CM | POA: Insufficient documentation

## 2019-08-26 DIAGNOSIS — C760 Malignant neoplasm of head, face and neck: Secondary | ICD-10-CM

## 2019-08-26 DIAGNOSIS — Z79899 Other long term (current) drug therapy: Secondary | ICD-10-CM | POA: Insufficient documentation

## 2019-08-26 DIAGNOSIS — D649 Anemia, unspecified: Secondary | ICD-10-CM | POA: Insufficient documentation

## 2019-08-26 LAB — CMP (CANCER CENTER ONLY)
ALT: 8 U/L (ref 0–44)
AST: 10 U/L — ABNORMAL LOW (ref 15–41)
Albumin: 2.9 g/dL — ABNORMAL LOW (ref 3.5–5.0)
Alkaline Phosphatase: 122 U/L (ref 38–126)
Anion gap: 13 (ref 5–15)
BUN: 24 mg/dL — ABNORMAL HIGH (ref 8–23)
CO2: 24 mmol/L (ref 22–32)
Calcium: 9.9 mg/dL (ref 8.9–10.3)
Chloride: 104 mmol/L (ref 98–111)
Creatinine: 0.99 mg/dL (ref 0.44–1.00)
GFR, Est AFR Am: >60 mL/min (ref >60)
GFR, Estimated: 57 mL/min — ABNORMAL LOW (ref >60)
Glucose, Bld: 157 mg/dL — ABNORMAL HIGH (ref 70–99)
Potassium: 3.8 mmol/L (ref 3.5–5.1)
Sodium: 141 mmol/L (ref 135–145)
Total Bilirubin: 0.5 mg/dL (ref 0.3–1.2)
Total Protein: 6.9 g/dL (ref 6.5–8.1)

## 2019-08-26 LAB — TSH: TSH: 1.443 u[IU]/mL (ref 0.308–3.960)

## 2019-08-26 LAB — CBC WITH DIFFERENTIAL (CANCER CENTER ONLY)
Abs Immature Granulocytes: 0.16 10*3/uL — ABNORMAL HIGH (ref 0.00–0.07)
Basophils Absolute: 0.1 10*3/uL (ref 0.0–0.1)
Basophils Relative: 0 %
Eosinophils Absolute: 0.2 10*3/uL (ref 0.0–0.5)
Eosinophils Relative: 1 %
HCT: 35.3 % — ABNORMAL LOW (ref 36.0–46.0)
Hemoglobin: 11.4 g/dL — ABNORMAL LOW (ref 12.0–15.0)
Immature Granulocytes: 1 %
Lymphocytes Relative: 23 %
Lymphs Abs: 4.6 10*3/uL — ABNORMAL HIGH (ref 0.7–4.0)
MCH: 28.9 pg (ref 26.0–34.0)
MCHC: 32.3 g/dL (ref 30.0–36.0)
MCV: 89.4 fL (ref 80.0–100.0)
Monocytes Absolute: 1.2 10*3/uL — ABNORMAL HIGH (ref 0.1–1.0)
Monocytes Relative: 6 %
Neutro Abs: 14.1 10*3/uL — ABNORMAL HIGH (ref 1.7–7.7)
Neutrophils Relative %: 69 %
Platelet Count: 380 10*3/uL (ref 150–400)
RBC: 3.95 MIL/uL (ref 3.87–5.11)
RDW: 13.2 % (ref 11.5–15.5)
WBC Count: 20.3 10*3/uL — ABNORMAL HIGH (ref 4.0–10.5)
nRBC: 0 % (ref 0.0–0.2)

## 2019-08-26 LAB — IRON AND TIBC
Iron: 22 ug/dL — ABNORMAL LOW (ref 41–142)
Saturation Ratios: 9 % — ABNORMAL LOW (ref 21–57)
TIBC: 259 ug/dL (ref 236–444)
UIBC: 237 ug/dL (ref 120–384)

## 2019-08-26 LAB — FERRITIN: Ferritin: 395 ng/mL — ABNORMAL HIGH (ref 11–307)

## 2019-08-26 NOTE — Telephone Encounter (Signed)
PET scan scheduled and pt is aware

## 2019-08-26 NOTE — Telephone Encounter (Signed)
Oncology Nurse Navigator Documentation  Requisition form and support documentation faxed to [employer]   Notification of successful fax transmission received.  Gayleen Orem, RN, BSN Head & Neck Oncology Nurse [place] at [place] [phone]

## 2019-08-26 NOTE — Telephone Encounter (Signed)
Scheduled appt per 3/24 los. Gave pt a print out of AVS.

## 2019-08-26 NOTE — Progress Notes (Signed)
Oncology Nurse Navigator Documentation  Met with Jillian Hartman during initial consult with Dr. Maylon Peppers. She was accompanied by her son, Jillian Hartman. . Further introduced myself as her Navigator, explained my role as a member of the Care Team. . Provided New Patient Information packet: o Contact information for physician, this navigator, other members of the Care Team o Advance Directive information ([place] blue pamphlet with LCSW insert); provided [facility] AD booklet at her request. o Fall Prevention Patient [place] sheet o Symptom Management Clinic information o [facility] campus map with highlight of [place] o SLP Information sheet . Provided and discussed educational handout for [facility], showed them example. . She voiced understanding of: Marland Kitchen Need for PET ASAP to better assess finds of recent CTs.  She agreed to call Radiology Scheduling to reschedule 4/8 appt.   . Referral to ENT for further evaluation. . They verbalized understanding of information provided. . Assisted with post-consult appt scheduling.  I encouraged them to call with questions/concerns moving forward.  They agreed to do so.  Gayleen Orem, RN, BSN Head & Neck Oncology Nurse [place] at [place] [phone]

## 2019-08-26 NOTE — Telephone Encounter (Signed)
Ok   R22.1 (ICD-10-CM) - Neck swelling  C78.00 (ICD-10-CM) - Malignant neoplasm metastatic to lung, unspecified laterality ([place])    thx

## 2019-08-29 ENCOUNTER — Ambulatory Visit: Payer: BC Managed Care – PPO | Attending: Internal Medicine

## 2019-08-29 DIAGNOSIS — Z23 Encounter for immunization: Secondary | ICD-10-CM

## 2019-08-29 NOTE — Progress Notes (Signed)
   Covid-19 Vaccination Clinic  Name:  Jillian Hartman    MRN: [id number] DOB: 1950/09/03  08/29/2019  [name] was observed post Covid-19 immunization for 15 minutes without incident. She was provided with Vaccine Information Sheet and instruction to access the V-Safe system.   [name] was instructed to call 911 with any severe reactions post vaccine: Marland Kitchen Difficulty breathing  . Swelling of face and throat  . A fast heartbeat  . A bad rash all over body  . Dizziness and weakness   Immunizations Administered    Name Date Dose VIS Date Route   Moderna COVID-19 Vaccine 08/29/2019  2:20 PM 0.5 mL 05/05/2019 Intramuscular   Manufacturer: Levan Hurst   Lot: [id number]   [place]: [id number]

## 2019-09-04 DIAGNOSIS — C760 Malignant neoplasm of head, face and neck: Secondary | ICD-10-CM | POA: Diagnosis not present

## 2019-09-07 ENCOUNTER — Ambulatory Visit (HOSPITAL_COMMUNITY)
Admission: RE | Admit: 2019-09-07 | Discharge: 2019-09-07 | Disposition: A | Discharge status: Home / Self Care | Payer: BC Managed Care – PPO | Source: Ambulatory Visit | Attending: Internal Medicine | Admitting: Internal Medicine

## 2019-09-07 ENCOUNTER — Ambulatory Visit (HOSPITAL_COMMUNITY): Payer: BC Managed Care – PPO

## 2019-09-07 ENCOUNTER — Other Ambulatory Visit: Payer: Self-pay

## 2019-09-07 DIAGNOSIS — C3490 Malignant neoplasm of unspecified part of unspecified bronchus or lung: Secondary | ICD-10-CM | POA: Diagnosis not present

## 2019-09-07 DIAGNOSIS — C7951 Secondary malignant neoplasm of bone: Secondary | ICD-10-CM | POA: Diagnosis not present

## 2019-09-07 DIAGNOSIS — C7800 Secondary malignant neoplasm of unspecified lung: Secondary | ICD-10-CM | POA: Diagnosis not present

## 2019-09-07 DIAGNOSIS — R221 Localized swelling, mass and lump, neck: Secondary | ICD-10-CM | POA: Insufficient documentation

## 2019-09-07 LAB — GLUCOSE, CAPILLARY: Glucose-Capillary: 140 mg/dL — ABNORMAL HIGH (ref 70–99)

## 2019-09-08 ENCOUNTER — Other Ambulatory Visit: Payer: Self-pay | Admitting: Hematology

## 2019-09-08 DIAGNOSIS — C760 Malignant neoplasm of head, face and neck: Secondary | ICD-10-CM

## 2019-09-09 ENCOUNTER — Other Ambulatory Visit: Payer: Self-pay

## 2019-09-09 ENCOUNTER — Encounter (HOSPITAL_COMMUNITY): Payer: Self-pay

## 2019-09-09 ENCOUNTER — Other Ambulatory Visit: Payer: Self-pay | Admitting: Hematology

## 2019-09-09 DIAGNOSIS — C760 Malignant neoplasm of head, face and neck: Secondary | ICD-10-CM

## 2019-09-09 NOTE — Progress Notes (Unsigned)
Bea Laura. Schlicker Female, 69 y.o., 13-Mar-1951 MRN:  [id number] Phone:  [phone] (H) ... PCP:  Plotnikov, [name], MD Primary Cvg:  [employer] Comm Ppo Next Appt With Oncology 09/16/2019 at 3:00 PM  RE: Biopsy Received: Today Message Contents  Tish Men, MD  Arne Cleveland, MD   Cc: Ivar Drape,   Thanks for checking. We reviewed her images at the tumor board this morning, and I just cancelled the procedure, as the mass near the thyroid may be a nodal mass.   Eulas Post   Previous Messages  ----- Message -----  From: Arne Cleveland, MD  Sent: 09/09/2019  8:40 AM EDT  To: Zada Finders, MD  Subject: RE: Biopsy                    Confirming that in this pt with path proven met squamous carcinoma  She needs a thyroid bx? Could not confirm that on review of notes.   Maryellen Pile    ----- Message -----  From: Lenore Cordia  Sent: 09/08/2019  4:10 PM EDT  To: Ir Procedure Requests  Subject: Biopsy                      Procedure Requested: [place] FNA    Reason for Procedure: FDG avid thyroid mass, need core biopsy    Provider Requesting: Tish Men  Provider Telephone: [phone]    Other Info: Rad Exams in Epic

## 2019-09-10 ENCOUNTER — Ambulatory Visit (HOSPITAL_COMMUNITY): Payer: BC Managed Care – PPO

## 2019-09-14 ENCOUNTER — Other Ambulatory Visit: Payer: Self-pay

## 2019-09-14 ENCOUNTER — Encounter (HOSPITAL_COMMUNITY): Payer: Self-pay | Admitting: Dentistry

## 2019-09-14 ENCOUNTER — Ambulatory Visit (HOSPITAL_COMMUNITY): Payer: Self-pay | Admitting: Dentistry

## 2019-09-14 VITALS — BP 133/59 | HR 108 | Temp 97.9°F

## 2019-09-14 DIAGNOSIS — K06010 Localized gingival recession, unspecified: Secondary | ICD-10-CM

## 2019-09-14 DIAGNOSIS — K030 Excessive attrition of teeth: Secondary | ICD-10-CM

## 2019-09-14 DIAGNOSIS — K0530 Chronic periodontitis, unspecified: Secondary | ICD-10-CM

## 2019-09-14 DIAGNOSIS — K036 Deposits [accretions] on teeth: Secondary | ICD-10-CM

## 2019-09-14 DIAGNOSIS — K045 Chronic apical periodontitis: Secondary | ICD-10-CM

## 2019-09-14 DIAGNOSIS — K08409 Partial loss of teeth, unspecified cause, unspecified class: Secondary | ICD-10-CM

## 2019-09-14 DIAGNOSIS — M264 Malocclusion, unspecified: Secondary | ICD-10-CM

## 2019-09-14 DIAGNOSIS — K031 Abrasion of teeth: Secondary | ICD-10-CM | POA: Diagnosis not present

## 2019-09-14 DIAGNOSIS — K0889 Other specified disorders of teeth and supporting structures: Secondary | ICD-10-CM

## 2019-09-14 DIAGNOSIS — M2630 Unspecified anomaly of tooth position of fully erupted tooth or teeth: Secondary | ICD-10-CM

## 2019-09-14 DIAGNOSIS — M270 Developmental disorders of jaws: Secondary | ICD-10-CM

## 2019-09-14 DIAGNOSIS — C760 Malignant neoplasm of head, face and neck: Secondary | ICD-10-CM

## 2019-09-14 DIAGNOSIS — Z01818 Encounter for other preprocedural examination: Secondary | ICD-10-CM

## 2019-09-14 DIAGNOSIS — C7951 Secondary malignant neoplasm of bone: Secondary | ICD-10-CM

## 2019-09-14 NOTE — Patient Instructions (Signed)
COVID-19 Vaccine Information can be found at: [email or web address] For questions related to vaccine distribution or appointments, please email [email or web address] or call [phone].   COVID-19 Education: The signs and symptoms of COVID-19 were discussed with the patient and how to seek care for testing (follow up with PCP or arrange E-visit).   The importance of social distancing was discussed today.  The patient has been referred to Dr. Huntley Dec for evaluation for root canal therapy of tooth #31 at the [facility] office. Patient will then follow-up with Dr. Donn Pierini for crown restoration #31 as indicated. Patient may also follow-up with Dr. Donn Pierini for a dental cleaning appointment as time and space permits prior to anticipated start of chemotherapy.  Lenn Cal, DDS

## 2019-09-14 NOTE — Progress Notes (Signed)
DENTAL CONSULTATION  Date of Consultation:  09/14/2019 Patient Name:   Jillian Hartman Date of Birth:   Jul 21, 1950 Medical Record Number: [id number]  COVID 19 SCREENING: The patient does not symptoms concerning for COVID-19 infection (Including fever, chills, cough, or new SHORTNESS OF BREATH).    VITALS: BP (!) 133/59 (BP Location: Right Arm)   Pulse (!) 108   Temp 97.9 F (36.6 C)   CHIEF COMPLAINT: Patient referred by Dr.Zhao for dental consultation.  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  Patient had recent PET scan which revealed multiple metastases including metastases to bone.  Patient with anticipated chemotherapy as well as Zometa or Xgeva therapy.  Patient is now seen as part of a medically necessary prechemotherapy and pre-Zometa or Xgeva therapy dental protocol examination.  The patient currently denies acute toothaches, swellings, or abscesses.  Patient was last seen by Dr. Ladell Pier on March 03, 2020 to have an exam and cleaning and PA taken of tooth #31.  Patient was subsequently referred to Dr. Loyal Gambler for an endodontic procedure on tooth #31.  Patient however did not proceed with root canal therapy at that time.  Patient, apparently , had come in contact with someone with COVID-19 and then developed COVID-19 symptoms as well. The patient indicates she never tested positive for the COVID-19 virus.  Patient developed a significant cough that prevented her from proceeding with root canal therapy by patient report.  Patient had previously been going to Dr. Vivia Ewing for many years until Dr. Ennis Forts retired.  Patient denies having partial dentures.  Patient denies having dental phobia.   PROBLEM LIST: Patient Active Problem List   Diagnosis Date Noted  . Squamous cell carcinoma of head and neck ([place]) 08/25/2019    Priority: High  . Neck swelling 07/15/2019  . Infected tooth 07/15/2019   . Cough 07/15/2019  . Weight loss 07/15/2019  . Contusion of multiple sites of right shoulder 04/25/2016  . Well adult exam 02/19/2015  . Hypokalemia 02/19/2015  . Edema 02/16/2015  . Trigeminy 02/16/2015  . Hematuria 10/25/2011  . Routine health maintenance 04/15/2011  . Essential hypertension 05/14/2007  . VARICOSE VEIN 05/14/2007    PMH: Past Medical History:  Diagnosis Date  . Borderline hyperlipidemia   . Hypertension   . Tonsillitis 12/29/2011  . Varicose vein     PSH: Past Surgical History:  Procedure Laterality Date  . ABDOMINAL HYSTERECTOMY    . CERVICAL CONIZATION W/BX     '77 after abnormal pap  . LAPAROSCOPY     '82 fertility work up    ALLERGIES: No Known Allergies  MEDICATIONS: Current Outpatient Medications  Medication Sig Dispense Refill  . Cholecalciferol (VITAMIN D3) 50 MCG (2000 UT) capsule Take 1 capsule (2,000 Units total) by mouth daily. 100 capsule 3  . diltiazem (CARDIZEM CD) 120 MG 24 hr capsule TAKE 1 CAPSULE BY MOUTH EVERY DAY (Patient taking differently: Take 120 mg by mouth daily. ) 90 capsule 3  . hydrochlorothiazide (HYDRODIURIL) 25 MG tablet TAKE 1 TABLET BY MOUTH EVERY DAY (Patient taking differently: Take 25 mg by mouth daily. ) 90 tablet 3  . KLOR-CON M10 10 MEQ tablet TAKE 1 TABLET (10 MEQ TOTAL) BY MOUTH 2 (TWO) TIMES DAILY. (Patient taking differently: Take 10 mEq by mouth 2 (two) times daily. ) 180 tablet 3  . MULTIPLE VITAMIN PO Take 1 tablet by mouth daily.     Marland Kitchen  olmesartan (BENICAR) 20 MG tablet Take 1 tablet (20 mg total) by mouth daily. 30 tablet 11  . benzonatate (TESSALON) 200 MG capsule Take 1 capsule (200 mg total) by mouth 2 (two) times daily as needed for cough. (Patient not taking: Reported on 09/14/2019) 20 capsule 0  . promethazine-codeine (PHENERGAN WITH CODEINE) 6.25-10 MG/5ML syrup Take 5 mLs by mouth every 4 (four) hours as needed. (Patient not taking: Reported on 09/14/2019) 300 mL 0   No current  facility-administered medications for this visit.    LABS: Lab Results  Component Value Date   WBC 20.3 (H) 08/26/2019   HGB 11.4 (L) 08/26/2019   HCT 35.3 (L) 08/26/2019   MCV 89.4 08/26/2019   PLT 380 08/26/2019      Component Value Date/Time   NA 141 08/26/2019 1223   K 3.8 08/26/2019 1223   CL 104 08/26/2019 1223   CO2 24 08/26/2019 1223   GLUCOSE 157 (H) 08/26/2019 1223   GLUCOSE 97 03/27/2006 1226   BUN 24 (H) 08/26/2019 1223   CREATININE 0.99 08/26/2019 1223   CALCIUM 9.9 08/26/2019 1223   GFRNONAA 57 (L) 08/26/2019 1223   GFRAA >60 08/26/2019 1223   No results found for: INR, PROTIME No results found for: PTT  SOCIAL HISTORY: Social History   Socioeconomic History  . Marital status: Divorced    Spouse name: Not on file  . Number of children: 2  . Years of education: 43  . Highest education level: Not on file  Occupational History  . 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 Smoking status: Never Smoker  . Smokeless tobacco: Never Used  Substance and Sexual Activity  . Alcohol use: Not on file  . Drug use: Not on file  . Sexual activity: Not on file  Other Topics Concern  . Not on file  Social History Narrative   Nurse, [employer] in [place]. Married 60- divorced [phone] son 68- 1 daughter died in MVA 9th grade. Monogamous relationship since 1998. 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               Social Determinants of Health   Financial Resource Strain:   . Difficulty of Paying Living Expenses:   Food Insecurity:   . Worried About Charity fundraiser in the Last Year:   . Arboriculturist in the Last Year:   Transportation Needs:   . Film/video editor (Medical):   Marland Kitchen Lack of Transportation (Non-Medical):   Physical Activity:   . Days of Exercise per Week:   . Minutes of Exercise per Session:   Stress:   . Feeling of Stress :   Social Connections:   . Frequency of Communication with Friends and Family:   . Frequency of  Social Gatherings with Friends and Family:   . Attends Religious Services:   . Active Member of Clubs or Organizations:   . Attends Archivist Meetings:   Marland Kitchen Marital Status:   Intimate Partner Violence:   . Fear of Current or Ex-Partner:   . Emotionally Abused:   Marland Kitchen Physically Abused:   . Sexually Abused:     FAMILY HISTORY: Family History  Problem Relation Age of Onset  . Hypertension Mother   . Hypertension Father   . Ulcers Father   . Cancer Father        oral cancer    REVIEW OF SYSTEMS: Reviewed with the patient as per History of present illness. Psych:  Patient denies having dental phobia.  DENTAL HISTORY: CHIEF COMPLAINT: Patient referred by Dr.Zhao for dental consultation.  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  Patient had recent PET scan which revealed multiple metastases including metastases to bone.  Patient with anticipated chemotherapy as well as Zometa or Xgeva therapy.  Patient is now seen as part of a medically necessary prechemotherapy and pre-Zometa or Xgeva therapy dental protocol examination.  The patient currently denies acute toothaches, swellings, or abscesses.  Patient was last seen by Dr. Ladell Pier on March 03, 2020 to have an exam and cleaning and PA taken of tooth #31.  Patient was subsequently referred to Dr. Loyal Gambler for an endodontic procedure on tooth #31.  Patient however did not proceed with root canal therapy at that time.  Patient, apparently , had come in contact with someone with COVID-19 and then developed COVID-19 symptoms as well. The patient indicates she never tested positive for the COVID-19 virus.  Patient developed a significant cough that prevented her from proceeding with root canal therapy by patient report.  Patient had previously been going to Dr. Vivia Ewing for many years until Dr. Ennis Forts retired.  Patient denies having partial dentures.   Patient denies having dental phobia.  DENTAL EXAMINATION: GENERAL: The patient is a well-developed, well-nourished female no acute distress. HEAD AND NECK: History of bilateral cervical shotty lymph nodes.  Patient denies acute TMJ symptoms. INTRAORAL EXAM: Patient has normal saliva.  Patient has bilateral mandibular lingual tori.  There is no evidence of oral abscess formation. DENTITION: Patient with multiple missing teeth numbers, 16, 17, 18, 30, and 32.  There is evidence of maxillary and mandibular incisal attrition.  Multiple flexure lesions are noted.  Multiple malpositioned teeth are noted. PERIODONTAL: Patient has chronic periodontitis with plaque and calculus accumulations, gingival recession, and incipient tooth mobility as per dental charting form.  There is incipient a moderate bone loss noted. DENTAL CARIES/SUBOPTIMAL RESTORATIONS: No obvious dental caries are noted.  Multiple flexure lesions are noted. ENDODONTIC: The patient currently denies acute pulpitis symptoms.  Patient does have periapical pathology and radiolucency associate with apices of tooth #31.  Patient has had previous root canal therapy associated with tooth #13 with no obvious persistent periapical pathology or radiolucency. CROWN AND BRIDGE: Patient has multiple crown restorations noted on tooth numbers 13, 19, and 20. PROSTHODONTIC: There are no partial dentures. OCCLUSION: Patient has a poor occlusal scheme secondary to multiple missing teeth, supra eruption drifting of the unopposed teeth into the edentulous areas, and lack replacement of all missing teeth with dental prostheses.  RADIOGRAPHIC INTERPRETATION: An orthopantogram was taken and supplemented with a full series of dental radiographs. There are multiple missing teeth.  There is supra eruption drifting of the unopposed teeth into the edentulous areas.  There is incipient a moderate bone loss.  There is a periapical radiolucency associated with the apex of  tooth #31.  Multiple dental resin, amalgam, and crown restorations are noted.  There is a previous root canal therapy associated with tooth #13.  There are radiopacities consistent with bilateral mandibular lingual tori noted.   ASSESSMENTS: 1.  Squamous cell carcinoma of the head neck 2.  Prechemotherapy and pre-Xgeva/Zometa therapy dental protocol examination 3.  Chronic apical periodontitis #31 4.  Multiple flexure lesions 5.  Maxillary and mandibular incisal attrition 6.  Bilateral mandibular lingual tori 7.  Chronic periodontitis with bone loss 8.  Gingival recession 9.  Accretions 10.  Incipient tooth mobility  11.  Multiple missing teeth 12.  Supra eruption and drifting of the unopposed teeth into the edentulous areas 13.  Multiple malpositioned teeth  14.  Poor occlusal scheme and malocclusion   PLAN/RECOMMENDATIONS: 1. I discussed the risks, benefits, and complications of various treatment options with the patient in relationship to her medical and dental conditions, anticipated chemotherapy, anticipated Xgeva or Zometa therapy, and risk for infection and osteonecrosis of the jaw.  We discussed various treatment options to include no treatment, extraction of tooth #31 ith alveoloplasty, pre-prosthetic surgery as indicated, periodontal therapy, dental restorations, root canal therapy, crown and bridge therapy, implant therapy, and replacement of missing teeth as indicated.  We also discussed referral to an endodontist for evaluation for root canal therapy of tooth #31.  We also discussed referral to an oral surgeon for extraction tooth #31 with preprosthetic surgery as needed if patient so desires.  The patient is currently adamant about not having tooth #31 extracted at this time.  The patient agrees to be referred to Dr. Huntley Dec evaluation for root canal therapy of tooth #31.  This has been scheduled with Dr. Loyal Gambler for tomorrow at 1 PM in the [place] office.  The patient will  then follow-up with her primary dentist, Dr. Ladell Pier, for crown restoration as indicated.  The patient may also proceed with a dental cleaning appointment with Dr. Donn Pierini as time and space permits prior to anticipated chemotherapy.  2. Discussion of findings with medical team and coordination of future medical and dental care as needed.  I spent in excess of  120 minutes during the conduct of this consultation and >50% of this time involved direct face-to-face encounter for counseling and/or coordination of the patient's care.    Lenn Cal, DDS

## 2019-09-15 DIAGNOSIS — C760 Malignant neoplasm of head, face and neck: Secondary | ICD-10-CM | POA: Diagnosis not present

## 2019-09-16 ENCOUNTER — Telehealth: Payer: Self-pay | Admitting: Pharmacist

## 2019-09-16 ENCOUNTER — Other Ambulatory Visit: Payer: Self-pay

## 2019-09-16 ENCOUNTER — Inpatient Hospital Stay: Payer: BC Managed Care – PPO | Attending: Hematology | Admitting: Hematology

## 2019-09-16 ENCOUNTER — Inpatient Hospital Stay: Payer: BC Managed Care – PPO

## 2019-09-16 ENCOUNTER — Encounter: Payer: Self-pay | Admitting: Hematology

## 2019-09-16 ENCOUNTER — Other Ambulatory Visit: Payer: Self-pay | Admitting: Hematology

## 2019-09-16 ENCOUNTER — Telehealth: Payer: Self-pay

## 2019-09-16 VITALS — BP 155/70 | HR 126 | Temp 98.7°F | Resp 18 | Ht 64.0 in | Wt 114.9 lb

## 2019-09-16 DIAGNOSIS — C7989 Secondary malignant neoplasm of other specified sites: Secondary | ICD-10-CM

## 2019-09-16 DIAGNOSIS — C760 Malignant neoplasm of head, face and neck: Secondary | ICD-10-CM

## 2019-09-16 DIAGNOSIS — D649 Anemia, unspecified: Secondary | ICD-10-CM | POA: Insufficient documentation

## 2019-09-16 DIAGNOSIS — D72829 Elevated white blood cell count, unspecified: Secondary | ICD-10-CM | POA: Diagnosis not present

## 2019-09-16 DIAGNOSIS — Z7189 Other specified counseling: Secondary | ICD-10-CM | POA: Diagnosis not present

## 2019-09-16 DIAGNOSIS — C7951 Secondary malignant neoplasm of bone: Secondary | ICD-10-CM | POA: Diagnosis not present

## 2019-09-16 DIAGNOSIS — C787 Secondary malignant neoplasm of liver and intrahepatic bile duct: Secondary | ICD-10-CM | POA: Insufficient documentation

## 2019-09-16 DIAGNOSIS — C778 Secondary and unspecified malignant neoplasm of lymph nodes of multiple regions: Secondary | ICD-10-CM | POA: Insufficient documentation

## 2019-09-16 DIAGNOSIS — Z79899 Other long term (current) drug therapy: Secondary | ICD-10-CM | POA: Insufficient documentation

## 2019-09-16 DIAGNOSIS — C801 Malignant (primary) neoplasm, unspecified: Secondary | ICD-10-CM | POA: Insufficient documentation

## 2019-09-16 DIAGNOSIS — C7800 Secondary malignant neoplasm of unspecified lung: Secondary | ICD-10-CM | POA: Insufficient documentation

## 2019-09-16 LAB — CMP (CANCER CENTER ONLY)
ALT: 7 U/L (ref 0–44)
AST: 9 U/L — ABNORMAL LOW (ref 15–41)
Albumin: 2.6 g/dL — ABNORMAL LOW (ref 3.5–5.0)
Alkaline Phosphatase: 126 U/L (ref 38–126)
Anion gap: 13 (ref 5–15)
BUN: 17 mg/dL (ref 8–23)
CO2: 25 mmol/L (ref 22–32)
Calcium: 10.4 mg/dL — ABNORMAL HIGH (ref 8.9–10.3)
Chloride: 99 mmol/L (ref 98–111)
Creatinine: 1.06 mg/dL — ABNORMAL HIGH (ref 0.44–1.00)
GFR, Est AFR Am: >60 mL/min (ref >60)
GFR, Estimated: 53 mL/min — ABNORMAL LOW (ref >60)
Glucose, Bld: 191 mg/dL — ABNORMAL HIGH (ref 70–99)
Potassium: 4.2 mmol/L (ref 3.5–5.1)
Sodium: 137 mmol/L (ref 135–145)
Total Bilirubin: 0.4 mg/dL (ref 0.3–1.2)
Total Protein: 6.8 g/dL (ref 6.5–8.1)

## 2019-09-16 LAB — CBC WITH DIFFERENTIAL (CANCER CENTER ONLY)
Abs Immature Granulocytes: 0.25 10*3/uL — ABNORMAL HIGH (ref 0.00–0.07)
Basophils Absolute: 0.1 10*3/uL (ref 0.0–0.1)
Basophils Relative: 0 %
Eosinophils Absolute: 0.3 10*3/uL (ref 0.0–0.5)
Eosinophils Relative: 1 %
HCT: 35.7 % — ABNORMAL LOW (ref 36.0–46.0)
Hemoglobin: 11.3 g/dL — ABNORMAL LOW (ref 12.0–15.0)
Immature Granulocytes: 1 %
Lymphocytes Relative: 18 %
Lymphs Abs: 4.8 10*3/uL — ABNORMAL HIGH (ref 0.7–4.0)
MCH: 28.6 pg (ref 26.0–34.0)
MCHC: 31.7 g/dL (ref 30.0–36.0)
MCV: 90.4 fL (ref 80.0–100.0)
Monocytes Absolute: 1.5 10*3/uL — ABNORMAL HIGH (ref 0.1–1.0)
Monocytes Relative: 6 %
Neutro Abs: 19.1 10*3/uL — ABNORMAL HIGH (ref 1.7–7.7)
Neutrophils Relative %: 74 %
Platelet Count: 446 10*3/uL — ABNORMAL HIGH (ref 150–400)
RBC: 3.95 MIL/uL (ref 3.87–5.11)
RDW: 13.3 % (ref 11.5–15.5)
WBC Count: 26 10*3/uL — ABNORMAL HIGH (ref 4.0–10.5)
nRBC: 0 % (ref 0.0–0.2)

## 2019-09-16 MED ORDER — DABRAFENIB MESYLATE 75 MG PO CAPS: 150.0000 mg | ORAL_CAPSULE | Freq: Two times a day (BID) | ORAL | 11 refills | Status: AC

## 2019-09-16 MED ORDER — TRAMETINIB DIMETHYL SULFOXIDE 2 MG PO TABS: 2.0000 mg | ORAL_TABLET | Freq: Every day | ORAL | 11 refills | Status: AC

## 2019-09-16 NOTE — Progress Notes (Signed)
[place] OFFICE PROGRESS NOTE  Patient Care Team: Plotnikov, [name], MD as PCP - General (Internal Medicine) Tish Men, MD as Consulting Physician (Hematology) Leota Sauers, RN (Inactive) as Oncology Nurse Navigator Malmfelt, Stephani Police, RN as Oncology Nurse Navigator (Oncology) Tish Men, MD as Consulting Physician (Hematology)  HEME/ONC OVERVIEW: 1. Metastatic squamous cell carcinoma, possibly lung primary malignancy; BRAF V600E mutation+ -09/2019:   Diffuse lymphadenopathy within the neck, axilla, thorax and upper abdomen, numerous bilateral pulmonary nodules; prominent thyroid gland extending into mediastinum on CT  R supraclavicular LN bx, squamous cell carcinoma; BRAF V600E mutation+; PD-L1 pending   Extensive metastatic disease involving the LN's (neck, chest and abdomen), lungs, liver and axial/appendicular skeleton   TREATMENT SUMMARY:  Dabrafenib/trametinib, date TBD  09/30/2019 - present: q99month Zometa (tentatively)  ASSESSMENT & PLAN:   Metastatic squamous cell carcinoma, possibly lung primary malignancy -I independently reviewed the radiologic images of her recent PET, and agree with the findings documented.  In summary, PET showed extensive disease involving the lymph nodes throughout the neck, chest, and abdomen, as well as metastases in the lungs, liver, and axial/appendicular skeleton.  -Her molecular studies from the lymph node biopsy showed BRAF V600E mutation, which is targetable by dabrafenib and trametinib. -Given the presence of BRAF V600E mutation in the squamous cell carcinoma, lung malignancy is a likely primary site.  Alternatively, BRAF mutation can be seen in thyroid cancer, but squamous cell carcinoma accounts for <1% of all thyroid cancers.  -We discussed some of the risks, benefits and side-effects of dabrafenib and trametinib.  The goal of treatment is for palliative intent only, NOT curative. -The dosing regimen is  dabrafenib 150 mg BID and trametinib 2 mg daily. -Some of the side-effects included, though not limited to, fever, photosensitivity, elevated liver enzymes, fatigue, weight loss, risk of allergic reactions, pancytopenia, life-threatening infections, need for transfusions of blood products, nausea, vomiting, change in bowel habits, admission to hospital for various reasons, and risks of death.    -The patient is aware that the response rates discussed earlier is not guaranteed.   -After a long discussion, patient made an informed decision to proceed with the prescribed plan of care.  -[facility]  -While awaiting the approval, I have ordered baseline echocardiogram to assess LVEF prior to starting trametinib.  Once she starts trametinib, she will need periodic echocardiogram as below:  One month after therapy initiation, then q2-358monthinterval   Extensive bony metastases -Due to the extensive bony metastases at increased risk for pathologic fracture, the patient would benefit from bisphosphonate to reduce the risk of adverse skeletal events -She was evaluated by dentistry, and recommended a root canal -We discussed the benefits and side effects of Zometa, including but not limited to, electrolyte abnormalities, myalgia, arthralgia, and in rare instances, osteonecrosis of the jaw.  Patient expressed understanding, and agreed to proceed with the treatment.  -As she just had root canal this week, we will tentatively start Zometa in 2 weeks (~09/30/2019) -Patient declined opioid medication at this time, as her back pain is relatively mild -However, if the pain worsens in the future, she can contact the clinic for opioid medication as needed  Enlarging mass in the neck -Most likely due to nodal disease  -Tumor measures at least 2-3cm, firm; no symptoms of dysphagia, dyspnea, or other obstructive symptoms -See the management of  systemic therapy above -If she develops any obstructive symptoms prior to  or during systemic therapy, we can pursue palliative radiation   Normocytic anemia -Most likely anemia of chronic disease -Hgb 11.3 today, stable -Patient denies any symptoms of bleeding -We will monitor it for now  Leukocytosis -WBC 26k today, stable; likely reactive in the setting of malignancy -Patient denies any symptoms of infection -We will monitor it for now  Goals of care discussion -Due to the widely metastatic disease, the goal of treatment would be for palliative intent only, NOT curative -Patient expressed understanding   Orders Placed This Encounter  Procedures  . CBC with Differential (Cancer Center Only)    Standing Status:   Standing    Number of Occurrences:   20    Standing Expiration Date:   09/15/2020  . CMP ([facility] only)    Standing Status:   Standing    Number of Occurrences:   20    Standing Expiration Date:   09/15/2020  . ECHOCARDIOGRAM COMPLETE    Standing Status:   Future    Standing Expiration Date:   12/15/2020    Order Specific Question:   Where should this test be performed    Answer:   [place]    Order Specific Question:   Perflutren DEFINITY (image enhancing agent) should be administered unless hypersensitivity or allergy exist    Answer:   Administer Perflutren    Order Specific Question:   Reason for exam-Echo    Answer:   Chemotherapy evaluation  v87.41 / v58.11    Order Specific Question:   Other Comments    Answer:   Baseline cardiac evaluation prior to starting chemotherapy    The total time spent in the encounter was 50 minutes, including face-to-face time with the patient, review of various tests results, order additional studies/medications, documentation, and coordination of care plan.   All questions were answered. The patient knows to call the clinic with any problems, questions or concerns. No barriers to learning was detected.  Return in 2 weeks  for labs, Zometa infusion and clinic appt.   Tish Men, MD 4/14/20214:17 PM  CHIEF COMPLAINT: "My back just started hurting a few weeks ago"  INTERVAL HISTORY: Jillian Hartman returns to clinic for follow-up of PET and recent biopsy results.  Patient reports that over the past few weeks, she has had intermittent upper and mid back pain.  The pain overall is mild in intensity, and she has not had to take any medication for pain.  She had a root canal done yesterday, and has taken a few doses of ibuprofen for pain.  Since the last visit, she has noticed enlarging mass in the midline just above the clavicles, nontender, without any associated dysphagia, dyspnea, or other obstructive symptoms.  She denies any other complaint today.  REVIEW OF SYSTEMS:   Constitutional: ( - ) fevers, ( - )  chills , ( - ) night sweats Eyes: ( - ) blurriness of vision, ( - ) double vision, ( - ) watery eyes Ears, nose, mouth, throat, and face: ( - ) mucositis, ( - ) sore throat Respiratory: ( - ) cough, ( - ) dyspnea, ( - ) wheezes Cardiovascular: ( - ) palpitation, ( - ) chest discomfort, ( - ) lower extremity swelling Gastrointestinal:  ( - ) nausea, ( - ) heartburn, ( - ) change in bowel habits Skin: ( - ) abnormal skin rashes Lymphatics: ( + ) new lymphadenopathy, ( - ) easy bruising Neurological: ( - ) numbness, ( - ) tingling, ( - )  new weaknesses Behavioral/Psych: ( - ) mood change, ( - ) new changes  All other systems were reviewed with the patient and are negative.  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 Prominently enlarged thyroid gland extending into the superior mediastinum and suprasternal notch with heterogeneous enhancement and coarse calcifications in the setting of prominent cervical and mediastinal lymphadenopathy and lung nodules is concerning  for malignancy. 2. Advanced cervical degenerative changes, with moderate to severe right neural foraminal narrowing at C3-4. 3. Chronic inflammatory changes of the right maxillary sinus.   07/17/2019 Imaging   CT chest: IMPRESSION: 1. Diffuse lymphadenopathy within the neck, axillary regions, mediastinum, hila, and upper abdomen. Differential would include metastatic disease versus lymphoma/leukemia. 2. Numerous bilateral pulmonary nodules consistent with metastatic disease. 3. Heterogeneous enlarged thyroid, nonspecific. Neoplasm not excluded. 4. Indeterminate hyperdensities within the splenic parenchyma, metastatic disease or lymphomatous involvement suspected.   08/19/2019 Pathology Results   FINAL MICROSCOPIC DIAGNOSIS:   A. LYMPH NODE, RIGHT SUPRACLAVICULAR, NEEDLE CORE BIOPSY:  - Squamous cell carcinoma, see comment  - Lymphoid tissue is not identified    COMMENT:   Immunohistochemical stains show that the tumor cells are positive for CK7, p63 and CK 5/6; and negative for CK20, TTF-1 and SOX-10, consistent  with above diagnosis.  Findings may suggest a lymph node entirely  replaced by tumor.  Dr. Tresa Moore reviewed the case and concurs with the diagnosis.  Dr. Alain Marion was paged on 08/21/2019.    08/25/2019 Initial Diagnosis   Squamous cell carcinoma of head and neck ([place])   09/07/2019 Imaging   PET: IMPRESSION: 1. Overall, there has been mild progression of disease. 2. Persistent adenopathy noted within the neck, chest, abdomen, and pelvis. These are all intensely FDG avid. When compared with the previous exam lymph nodes are either stable or slightly increased in size in the interval. 3. Multifocal FDG avid pulmonary nodules are identified which are stable to increased in size in the interval. 4. Multifocal FDG avid liver metastasis are increased in size and number compared with previous exam. 5. Widespread FDG avid lytic bone metastases are noted involving the axial and  appendicular skeleton. Lesions were not confidently identified on previous CT of the neck, chest and upper abdomen. 6. New moderate left pleural effusion 7.  Aortic Atherosclerosis (ICD10-I70.0).   09/16/2019 Cancer Staging   Staging form: Cervical Lymph Nodes and Unknown Primary Tumors of the Head and Neck, AJCC 8th Edition - Clinical: Stage IVC (cT0, cN2c, cM1) - Signed by Tish Men, MD on 09/16/2019     I have reviewed the past medical history, past surgical history, social history and family history with the patient and they are unchanged from previous note.  ALLERGIES:  has No Known Allergies.  MEDICATIONS:  Current Outpatient Medications  Medication Sig Dispense Refill  . amoxicillin (AMOXIL) 500 MG capsule Take 500 mg by mouth 2 (two) times daily.    . benzonatate (TESSALON) 200 MG capsule Take 1 capsule (200 mg total) by mouth 2 (two) times daily as needed for cough. 20 capsule 0  . Cholecalciferol (VITAMIN D3) 50 MCG (2000 UT) capsule Take 1 capsule (2,000 Units total) by mouth daily. (Patient taking differently: Take 2,000 Units by mouth 2 (two) times daily. ) 100 capsule 3  . dabrafenib mesylate (TAFINLAR) 75 MG capsule Take 2 capsules (150 mg total) by mouth 2 (two) times daily.  Take on an empty stomach 1 hour before or 2 hours after meals. 120 capsule 11  . diltiazem (CARDIZEM CD) 120 MG 24 hr capsule TAKE 1 CAPSULE BY MOUTH EVERY DAY (Patient taking differently: Take 120 mg by mouth daily. ) 90 capsule 3  . hydrochlorothiazide (HYDRODIURIL) 25 MG tablet TAKE 1 TABLET BY MOUTH EVERY DAY (Patient taking differently: Take 25 mg by mouth daily. ) 90 tablet 3  . KLOR-CON M10 10 MEQ tablet TAKE 1 TABLET (10 MEQ TOTAL) BY MOUTH 2 (TWO) TIMES DAILY. (Patient taking differently: Take 10 mEq by mouth 2 (two) times daily. ) 180 tablet 3  . lisinopril (ZESTRIL) 20 MG tablet Take 20 mg by mouth daily.    . MULTIPLE VITAMIN PO Take 1 tablet by mouth daily.     Marland Kitchen olmesartan (BENICAR) 20 MG  tablet Take 1 tablet (20 mg total) by mouth daily. 30 tablet 11  . Potassium 75 MG TABS Take 10 mg by mouth 2 (two) times daily.    . promethazine-codeine (PHENERGAN WITH CODEINE) 6.25-10 MG/5ML syrup Take 5 mLs by mouth every 4 (four) hours as needed. 300 mL 0  . trametinib dimethyl sulfoxide (MEKINIST) 2 MG tablet Take 1 tablet (2 mg total) by mouth daily. Take 1 hour before or 2 hours after a meal. Store refrigerated in original container. 30 tablet 11   No current facility-administered medications for this visit.    PHYSICAL EXAMINATION: ECOG PERFORMANCE STATUS: 0 - Asymptomatic  Today's Vitals   09/16/19 1523 09/16/19 1526  BP: (!) 155/70   Pulse: (!) 126   Resp: 18   Temp: 98.7 F (37.1 C)   TempSrc: Temporal   SpO2: 100%   Weight: 114 lb 14.4 oz (52.1 kg)   Height: 5' 4" (1.626 m)   PainSc:  3    Body mass index is 19.72 kg/m.  Filed Weights   09/16/19 1523  Weight: 114 lb 14.4 oz (52.1 kg)    GENERAL: alert, no distress and comfortable SKIN: skin color, texture, turgor are normal, no rashes or significant lesions EYES: conjunctiva are pink and non-injected, sclera clear OROPHARYNX: no exudate, no erythema; lips, buccal mucosa, and tongue normal  NECK: a firm mass in the midline just above the clavicles, non-tender, measuring ~2-3cm, firm, non-mobile, no skin erosion  LYMPH:  Bilateral cervical adenopathy  LUNGS: clear to auscultation with normal breathing effort HEART: regular rate & rhythm and no murmurs and no lower extremity edema ABDOMEN: soft, non-tender, non-distended, normal bowel sounds Musculoskeletal: no cyanosis of digits and no clubbing  PSYCH: alert & oriented x 3, fluent speech  LABORATORY DATA:  I have reviewed the data as listed    Component Value Date/Time   NA 137 09/16/2019 1507   K 4.2 09/16/2019 1507   CL 99 09/16/2019 1507   CO2 25 09/16/2019 1507   GLUCOSE 191 (H) 09/16/2019 1507   GLUCOSE 97 03/27/2006 1226   BUN 17 09/16/2019 1507    CREATININE 1.06 (H) 09/16/2019 1507   CALCIUM 10.4 (H) 09/16/2019 1507   PROT 6.8 09/16/2019 1507   ALBUMIN 2.6 (L) 09/16/2019 1507   AST 9 (L) 09/16/2019 1507   ALT 7 09/16/2019 1507   ALKPHOS 126 09/16/2019 1507   BILITOT 0.4 09/16/2019 1507   GFRNONAA 53 (L) 09/16/2019 1507   GFRAA >60 09/16/2019 1507    No results found for: SPEP, UPEP  Lab Results  Component Value Date   WBC 26.0 (H) 09/16/2019   NEUTROABS 19.1 (H)  09/16/2019   HGB 11.3 (L) 09/16/2019   HCT 35.7 (L) 09/16/2019   MCV 90.4 09/16/2019   PLT 446 (H) 09/16/2019      Chemistry      Component Value Date/Time   NA 137 09/16/2019 1507   K 4.2 09/16/2019 1507   CL 99 09/16/2019 1507   CO2 25 09/16/2019 1507   BUN 17 09/16/2019 1507   CREATININE 1.06 (H) 09/16/2019 1507      Component Value Date/Time   CALCIUM 10.4 (H) 09/16/2019 1507   ALKPHOS 126 09/16/2019 1507   AST 9 (L) 09/16/2019 1507   ALT 7 09/16/2019 1507   BILITOT 0.4 09/16/2019 1507       RADIOGRAPHIC STUDIES: I have personally reviewed the radiological images as listed below and agreed with the findings in the report. NM PET Image Initial (PI) Skull Base To Thigh  Result Date: 09/07/2019 CLINICAL DATA:  Subsequent treatment strategy for non-small cell lung cancer. EXAM: NUCLEAR MEDICINE PET SKULL BASE TO THIGH TECHNIQUE: 6.4 mCi F-18 FDG was injected intravenously. Full-ring PET imaging was performed from the skull base to thigh after the radiotracer. CT data was obtained and used for attenuation correction and anatomic localization. Fasting blood glucose: 140 mg/dl COMPARISON:  07/16/2018 FINDINGS: Mediastinal blood pool activity: SUV max 2.07 Liver activity: SUV max NA NECK: Extensive, bilateral FDG avid cervical adenopathy identified. Index right cervical node measures 1.8 cm with SUV max 21, image 29/4. Previously this node measured 1.4 cm. Index left posterior cervical node measures 1.3 cm within SUV max of 8.9. Previously this measured  0.8 cm. Incidental CT findings: none CHEST: Extensive FDG avid bilateral supraclavicular lymph nodes identified. Index left supraclavicular lymph node measures 2 cm and has an SUV max of 15.8, image 42/4. Previously 1.6 cm Right supraclavicular node measures 2.0 cm within SUV max 11.4, image 44/4. Previously 1.7 cm. Extensive FDG avid mediastinal and bilateral hilar adenopathy. Index pre-vascular nodal mass on the left measures 3.2 cm and has an SUV max of 15.14. Previously this measured 3.0 cm. The index subcarinal node measures 2.2 cm within SUV max of 15.09. Previously this measured 2.3 cm. FDG avid mass involving the thyroid gland is identified. This is centered around the isthmus and extends into the right lobe measuring 5.3 cm in longest dimension with an SUV max of 30.5, image 48/4. This is compared with 5.2 cm previously. There is a new small to moderate left pleural effusion. Bilateral pulmonary nodules are identified: -medial left upper lobe lung nodule measures 1.6 cm within SUV max 14.5, image 28/8. Previously 0.7 cm -index right upper lobe lung nodule measures 1.6 cm within SUV max of 7.9, image 42/8. Previously this measured 0.7 cm. -index central left lower lobe lung nodule measures 1.5 cm with SUV max of 8.3, image 40/8. Previously this measured the same. Incidental CT findings: Aortic atherosclerosis. RCA and LAD coronary artery calcifications. ABDOMEN/PELVIS: FDG avid liver metastases identified. -Index lesion between segment 2 and 3 measures 2 cm with SUV max 22, image 107/4. Previously this lesion measured 1 cm. -Segment 2 liver lesion measures 2.4 cm with SUV max 30.3, image 101/4. New from previous exam. Multiple FDG avid abdominopelvic lymph nodes. -index left retroperitoneal node measures 1.5 cm within SUV max of 11.5. Previously this measured 0.8 cm Right common iliac lymph node measures 1.4 cm and has an SUV max of 12.2, image 142/4. Necrotic right external iliac node measures 2.3 cm with  SUV max 11.8, image 165/4. Incidental CT  findings: Aortic atherosclerosis. SKELETON: Extensive multifocal FDG avid lytic bone metastases identified. Lesions are too numerous to count Index lesion involving the C7 vertebra measures 1.1 cm with SUV max 10.2, image 2/8. Index lesion involving the T11 vertebra measures 1.9 cm and has an SUV max of 18, image 60/8. New from previous exam. Index lesion within the left acetabulum measures 2.5 cm and has an SUV max 21.9. Incidental CT findings: none IMPRESSION: 1. Overall, there has been mild progression of disease. 2. Persistent adenopathy noted within the neck, chest, abdomen, and pelvis. These are all intensely FDG avid. When compared with the previous exam lymph nodes are either stable or slightly increased in size in the interval. 3. Multifocal FDG avid pulmonary nodules are identified which are stable to increased in size in the interval. 4. Multifocal FDG avid liver metastasis are increased in size and number compared with previous exam. 5. Widespread FDG avid lytic bone metastases are noted involving the axial and appendicular skeleton. Lesions were not confidently identified on previous CT of the neck, chest and upper abdomen. 6. New moderate left pleural effusion 7.  Aortic Atherosclerosis (ICD10-I70.0). Electronically Signed   By: Kerby Moors M.D.   On: 09/07/2019 16:08   [place] CORE BIOPSY (LYMPH NODES)  Result Date: 08/19/2019 INDICATION: 69 year old with chest and neck lymphadenopathy. Pulmonary nodules. Tissue diagnosis is needed. EXAM: ULTRASOUND-GUIDED RIGHT CERVICAL LYMPH NODE BIOPSY MEDICATIONS: None. ANESTHESIA/SEDATION: Moderate (conscious) sedation was employed during this procedure. A total of Versed 1.0 mg and Fentanyl 50 mcg was administered intravenously. Moderate Sedation Time: 13 minutes. The patient's level of consciousness and vital signs were monitored continuously by radiology nursing throughout the procedure under my direct supervision.  FLUOROSCOPY TIME:  None COMPLICATIONS: None immediate. PROCEDURE: Informed written consent was obtained from the patient after a thorough discussion of the procedural risks, benefits and alternatives. All questions were addressed. A timeout was performed prior to the initiation of the procedure. Both sides of the neck were evaluated with ultrasound. A right supraclavicular lymph node was identified and targeted for biopsy. Skin was prepped with chlorhexidine and sterile field was created. Skin and soft tissues were anesthetized with 1% lidocaine. Using ultrasound guidance, 18 gauge core needle was directed into the right cervical lymph node. Total of 6 core biopsies were obtained. Specimens placed in saline. Bandage placed over the puncture site. FINDINGS: Numerous hypoechoic enlarged lymph nodes on both sides of the neck. A right supraclavicular lymph node was biopsied. Needle position confirmed within the lymph node. IMPRESSION: Ultrasound-guided core biopsy of a right cervical lymph node. Electronically Signed   By: Markus Daft M.D.   On: 08/19/2019 16:14

## 2019-09-16 NOTE — Telephone Encounter (Signed)
Oral Oncology Patient Advocate Encounter  Received notification from [facility] that prior authorization for [place] and Mekinist is required.  PA submitted on CoverMyMeds Tafinlar Key [id number] Mekinist key [id number] Status is pending  Oral Oncology Clinic will continue to follow.  [place] Patient [place]ned Phone [phone] Fax [phone] 09/16/2019 3:56 PM

## 2019-09-16 NOTE — Telephone Encounter (Signed)
Oral Oncology Pharmacist Encounter  Received new prescription for Mekinist (trametinib) and Tafinlar (dabrafenib) for the treatment of metastatic squamous cell carcinoma, BRAF V600E mutation positive, planned duration until disease progression or unacceptable drug toxicity.  ECHO ordered. Prescription dose and frequency assessed.   Current medication list in Epic reviewed, one DDIs with dabrafenib identified: -Dabrafenib may decrease the concentration of diltiazem. Monitor patient for decreased effectiveness of diltiazem.  Prescription has been e-scribed to the [facility] for benefits analysis and approval.  Oral Oncology Clinic will continue to follow for insurance authorization, copayment issues, initial counseling and start date.  Darl Pikes, PharmD, BCPS, BCOP, CPP Hematology/Oncology Clinical Pharmacist [facility] [place] [facility] [phone]  09/16/2019 4:32 PM

## 2019-09-17 NOTE — Telephone Encounter (Signed)
Oral Oncology Patient Advocate Encounter  Prior Authorization for Mekinist and [place] has been approved.    Mekinist PA# [id number] Vonna Kotyk [id number] Effective dates: 09/16/19 through 09/14/20  Patients co-pay is $0/each  Oral Oncology Clinic will continue to follow.   [place] Patient [place] Phone [phone] Fax [phone] 09/17/2019 8:22 AM

## 2019-09-17 NOTE — Telephone Encounter (Signed)
Oral Chemotherapy Pharmacist Encounter  Patient Education I spoke with patient and her son for overview of new oral chemotherapy medication: Mekinist (trametinib) and Tafinlar (dabrafenib) for the treatment of metastatic squamous cell carcinoma, BRAF V600E mutation positive, planned duration until disease progression or unacceptable drug toxicity..   Counseled patient on administration, dosing, side effects, monitoring, drug-food interactions, safe handling, storage, and disposal. Patient will take: Mekinist: Take 1 tablet (2 mg total) by mouth daily. Take 1 hour before or 2 hours after a meal. Store refrigerated in Financial controller: Take 2 capsules (150 mg total) by mouth 2 (two) times daily. Take on an empty stomach 1 hour before or 2 hours after meals  Side effects include but not limited to: Mekinist: rash/itchy skin, diarrhea, edema Tafinlar: rash/itchy skin, fever, HA, fatigue  Reviewed with patient importance of keeping a medication schedule and plan for any missed doses.  They voiced understanding and appreciation. All questions answered. Medication handout placed in the mail.  Provided patient with [facility] phone number. Patient knows to call the office with questions or concerns. Oral Chemotherapy Navigation Clinic will continue to follow.  Darl Pikes, PharmD, BCPS, BCOP, CPP Hematology/Oncology Clinical Pharmacist [facility] [place] [facility] [phone]  09/17/2019 4:23 PM

## 2019-09-22 ENCOUNTER — Other Ambulatory Visit (HOSPITAL_COMMUNITY): Payer: BC Managed Care – PPO

## 2019-09-24 ENCOUNTER — Ambulatory Visit (HOSPITAL_COMMUNITY)
Admission: RE | Admit: 2019-09-24 | Discharge: 2019-09-24 | Disposition: A | Discharge status: Home / Self Care | Payer: BC Managed Care – PPO | Source: Ambulatory Visit | Attending: Hematology | Admitting: Hematology

## 2019-09-24 ENCOUNTER — Other Ambulatory Visit: Payer: Self-pay

## 2019-09-24 DIAGNOSIS — C801 Malignant (primary) neoplasm, unspecified: Secondary | ICD-10-CM | POA: Insufficient documentation

## 2019-09-24 DIAGNOSIS — Z01818 Encounter for other preprocedural examination: Secondary | ICD-10-CM | POA: Diagnosis not present

## 2019-09-24 DIAGNOSIS — I119 Hypertensive heart disease without heart failure: Secondary | ICD-10-CM | POA: Insufficient documentation

## 2019-09-24 DIAGNOSIS — C7989 Secondary malignant neoplasm of other specified sites: Secondary | ICD-10-CM | POA: Diagnosis not present

## 2019-09-24 MED FILL — MEKINIST 2 MG TAB: 2 | 30 days supply | Qty: 30 | Fill #0

## 2019-09-24 MED FILL — TAFINLAR 75 MG CAPSULE: 75 | 30 days supply | Qty: 120 | Fill #0

## 2019-09-24 NOTE — Progress Notes (Signed)
  Echocardiogram 2D Echocardiogram has been performed.  Jillian Hartman 09/24/2019, 2:15 PM

## 2019-09-30 ENCOUNTER — Telehealth: Payer: Self-pay | Admitting: Hematology

## 2019-09-30 ENCOUNTER — Inpatient Hospital Stay: Payer: BC Managed Care – PPO | Admitting: Medical

## 2019-09-30 ENCOUNTER — Inpatient Hospital Stay: Payer: BC Managed Care – PPO

## 2019-09-30 ENCOUNTER — Inpatient Hospital Stay: Payer: BC Managed Care – PPO | Admitting: Nutrition

## 2019-09-30 ENCOUNTER — Inpatient Hospital Stay: Payer: BC Managed Care – PPO | Admitting: Hematology

## 2019-09-30 ENCOUNTER — Encounter: Payer: Self-pay | Admitting: Hematology

## 2019-09-30 ENCOUNTER — Other Ambulatory Visit: Payer: Self-pay

## 2019-09-30 VITALS — BP 128/58 | HR 87 | Temp 98.0°F | Resp 18 | Ht 64.0 in | Wt 111.3 lb

## 2019-09-30 VITALS — BP 107/52 | HR 64

## 2019-09-30 DIAGNOSIS — C7989 Secondary malignant neoplasm of other specified sites: Secondary | ICD-10-CM

## 2019-09-30 DIAGNOSIS — D72829 Elevated white blood cell count, unspecified: Secondary | ICD-10-CM

## 2019-09-30 DIAGNOSIS — C778 Secondary and unspecified malignant neoplasm of lymph nodes of multiple regions: Secondary | ICD-10-CM | POA: Diagnosis not present

## 2019-09-30 DIAGNOSIS — D649 Anemia, unspecified: Secondary | ICD-10-CM

## 2019-09-30 DIAGNOSIS — C7951 Secondary malignant neoplasm of bone: Secondary | ICD-10-CM | POA: Diagnosis not present

## 2019-09-30 DIAGNOSIS — C801 Malignant (primary) neoplasm, unspecified: Secondary | ICD-10-CM

## 2019-09-30 DIAGNOSIS — C7800 Secondary malignant neoplasm of unspecified lung: Secondary | ICD-10-CM | POA: Diagnosis not present

## 2019-09-30 DIAGNOSIS — C787 Secondary malignant neoplasm of liver and intrahepatic bile duct: Secondary | ICD-10-CM | POA: Diagnosis not present

## 2019-09-30 DIAGNOSIS — Z79899 Other long term (current) drug therapy: Secondary | ICD-10-CM | POA: Diagnosis not present

## 2019-09-30 LAB — CBC WITH DIFFERENTIAL (CANCER CENTER ONLY)
Abs Immature Granulocytes: 0.22 10*3/uL — ABNORMAL HIGH (ref 0.00–0.07)
Basophils Absolute: 0.2 10*3/uL — ABNORMAL HIGH (ref 0.0–0.1)
Basophils Relative: 1 %
Eosinophils Absolute: 0.3 10*3/uL (ref 0.0–0.5)
Eosinophils Relative: 1 %
HCT: 34.8 % — ABNORMAL LOW (ref 36.0–46.0)
Hemoglobin: 11 g/dL — ABNORMAL LOW (ref 12.0–15.0)
Immature Granulocytes: 1 %
Lymphocytes Relative: 69 %
Lymphs Abs: 27.5 10*3/uL — ABNORMAL HIGH (ref 0.7–4.0)
MCH: 27.5 pg (ref 26.0–34.0)
MCHC: 31.6 g/dL (ref 30.0–36.0)
MCV: 87 fL (ref 80.0–100.0)
Monocytes Absolute: 0.8 10*3/uL (ref 0.1–1.0)
Monocytes Relative: 2 %
Neutro Abs: 10 10*3/uL — ABNORMAL HIGH (ref 1.7–7.7)
Neutrophils Relative %: 26 %
Platelet Count: 458 10*3/uL — ABNORMAL HIGH (ref 150–400)
RBC: 4 MIL/uL (ref 3.87–5.11)
RDW: 14.1 % (ref 11.5–15.5)
WBC Count: 39 10*3/uL — ABNORMAL HIGH (ref 4.0–10.5)
nRBC: 0 % (ref 0.0–0.2)

## 2019-09-30 LAB — CMP (CANCER CENTER ONLY)
ALT: 10 U/L (ref 0–44)
AST: 20 U/L (ref 15–41)
Albumin: 2.8 g/dL — ABNORMAL LOW (ref 3.5–5.0)
Alkaline Phosphatase: 201 U/L — ABNORMAL HIGH (ref 38–126)
Anion gap: 12 (ref 5–15)
BUN: 37 mg/dL — ABNORMAL HIGH (ref 8–23)
CO2: 24 mmol/L (ref 22–32)
Calcium: 9.2 mg/dL (ref 8.9–10.3)
Chloride: 104 mmol/L (ref 98–111)
Creatinine: 1 mg/dL (ref 0.44–1.00)
GFR, Est AFR Am: >60 mL/min (ref >60)
GFR, Estimated: 57 mL/min — ABNORMAL LOW (ref >60)
Glucose, Bld: 103 mg/dL — ABNORMAL HIGH (ref 70–99)
Potassium: 3.9 mmol/L (ref 3.5–5.1)
Sodium: 140 mmol/L (ref 135–145)
Total Bilirubin: 0.3 mg/dL (ref 0.3–1.2)
Total Protein: 6.4 g/dL — ABNORMAL LOW (ref 6.5–8.1)

## 2019-09-30 MED ORDER — ZOLEDRONIC ACID 4 MG/5ML IV CONC
3.3000 mg | Freq: Once | INTRAVENOUS | Status: AC
  Administered 2019-09-30: 16:00:00 3.3 mg via INTRAVENOUS
  Filled 2019-09-30: qty 4.13

## 2019-09-30 MED ORDER — ZOLEDRONIC ACID 4 MG/100ML IV SOLN: 4.0000 mg | Freq: Once | INTRAVENOUS | Status: DC

## 2019-09-30 MED ORDER — SODIUM CHLORIDE 0.9 % IV SOLN
Freq: Once | INTRAVENOUS | Status: AC
  Filled 2019-09-30: qty 250

## 2019-09-30 NOTE — Patient Instructions (Signed)
Zoledronic Acid injection (Hypercalcemia, Oncology) What is this medicine? ZOLEDRONIC ACID (ZOE le dron ik AS id) lowers the amount of calcium loss from bone. It is used to treat too much calcium in your blood from cancer. It is also used to prevent complications of cancer that has spread to the bone. This medicine may be used for other purposes; ask your health care provider or pharmacist if you have questions. COMMON BRAND NAME(S): Zometa What should I tell my health care provider before I take this medicine? They need to know if you have any of these conditions:  aspirin-sensitive asthma  cancer, especially if you are receiving medicines used to treat cancer  dental disease or wear dentures  infection  kidney disease  receiving corticosteroids like dexamethasone or prednisone  an unusual or allergic reaction to zoledronic acid, other medicines, foods, dyes, or preservatives  pregnant or trying to get pregnant  breast-feeding How should I use this medicine? This medicine is for infusion into a vein. It is given by a health care professional in a hospital or clinic setting. Talk to your pediatrician regarding the use of this medicine in children. Special care may be needed. Overdosage: If you think you have taken too much of this medicine contact a poison control center or emergency room at once. NOTE: This medicine is only for you. Do not share this medicine with others. What if I miss a dose? It is important not to miss your dose. Call your doctor or health care professional if you are unable to keep an appointment. What may interact with this medicine?  certain antibiotics given by injection  NSAIDs, medicines for pain and inflammation, like ibuprofen or naproxen  some diuretics like bumetanide, furosemide  teriparatide  thalidomide This list may not describe all possible interactions. Give your health care provider a list of all the medicines, herbs, non-prescription  drugs, or dietary supplements you use. Also tell them if you smoke, drink alcohol, or use illegal drugs. Some items may interact with your medicine. What should I watch for while using this medicine? Visit your doctor or health care professional for regular checkups. It may be some time before you see the benefit from this medicine. Do not stop taking your medicine unless your doctor tells you to. Your doctor may order blood tests or other tests to see how you are doing. Women should inform their doctor if they wish to become pregnant or think they might be pregnant. There is a potential for serious side effects to an unborn child. Talk to your health care professional or pharmacist for more information. You should make sure that you get enough calcium and vitamin D while you are taking this medicine. Discuss the foods you eat and the vitamins you take with your health care professional. Some people who take this medicine have severe bone, joint, and/or muscle pain. This medicine may also increase your risk for jaw problems or a broken thigh bone. Tell your doctor right away if you have severe pain in your jaw, bones, joints, or muscles. Tell your doctor if you have any pain that does not go away or that gets worse. Tell your dentist and dental surgeon that you are taking this medicine. You should not have major dental surgery while on this medicine. See your dentist to have a dental exam and fix any dental problems before starting this medicine. Take good care of your teeth while on this medicine. Make sure you see your dentist for regular follow-up   appointments. What side effects may I notice from receiving this medicine? Side effects that you should report to your doctor or health care professional as soon as possible:  allergic reactions like skin rash, itching or hives, swelling of the face, lips, or tongue  anxiety, confusion, or depression  breathing problems  changes in vision  eye  pain  feeling faint or lightheaded, falls  jaw pain, especially after dental work  mouth sores  muscle cramps, stiffness, or weakness  redness, blistering, peeling or loosening of the skin, including inside the mouth  trouble passing urine or change in the amount of urine Side effects that usually do not require medical attention (report to your doctor or health care professional if they continue or are bothersome):  bone, joint, or muscle pain  constipation  diarrhea  fever  hair loss  irritation at site where injected  loss of appetite  nausea, vomiting  stomach upset  trouble sleeping  trouble swallowing  weak or tired This list may not describe all possible side effects. Call your doctor for medical advice about side effects. You may report side effects to FDA at 1-800-FDA-1088. Where should I keep my medicine? This drug is given in a hospital or clinic and will not be stored at home. NOTE: This sheet is a summary. It may not cover all possible information. If you have questions about this medicine, talk to your doctor, pharmacist, or health care provider.  2020 [employer] Standard (2013-10-17 14:19:39)  

## 2019-09-30 NOTE — Telephone Encounter (Signed)
Scheduled per los. Gave avs and calendar  

## 2019-09-30 NOTE — Progress Notes (Signed)
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  She is a patient of Dr. Maylon Peppers.  She has bony metastases.  She is receiving Zometa.  Past medical history includes hypertension and hyperlipidemia.  Medications include vitamin D, multivitamin, potassium, and Phenergan.  Labs include glucose 191 and albumin 2.6 on April 14.  Height: 64 inches. Weight: 111.3 pounds. Usual body weight: 148 pounds in November 2019. BMI: 19.1.  Patient has lost 25% of usual body weight. She reports early satiety and difficulty eating smaller more frequent meals and snacks.  Nutrition diagnosis: Unintended weight loss related to cancer and associated treatments as evidenced by 25% wt loss.  Intervention: Educated patient to consume small snacks between meals and provided examples. Reviewed high calorie, high protein foods. Provided fact sheets. Recommended oral nutrition supplements and provided samples. Questions answered.  Monitoring, Evaluation, Goals: Patient will increase calories and protein to promote repletion.  Next Visit:To be scheduled as needed.

## 2019-09-30 NOTE — Progress Notes (Signed)
renal adjusted for bone mets CrCl = 41 mL/min 09/30/19 Zometa 3.3 mg.  Kennith Center, Pharm.D., CPP 09/30/2019@4 :00 PM

## 2019-09-30 NOTE — Progress Notes (Signed)
At approximately 1535, patient c/o feeling clammy after RN placed IV. RN assessed patient and obtained VS. O2 applied at 2L via N/C. Patient was delayed in responding to RN's questions and had difficulty verbalizing where she was or how she was feeling. Sandi Mealy, PA notified of situation. VS documented. After a few minutes, patient became more alert and able to answer questions. 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 RN offered food and beverage and patient accepted. Patient stated she felt better.

## 2019-09-30 NOTE — Progress Notes (Signed)
[facility] OFFICE PROGRESS NOTE  Patient Care Team: Plotnikov, [name], MD as PCP - General (Internal Medicine) Tish Men, MD as Consulting Physician (Hematology) Leota Sauers, RN (Inactive) as Oncology Nurse Navigator Malmfelt, Stephani Police, RN as Oncology Nurse Navigator (Oncology) Tish Men, MD as Consulting Physician (Hematology)  HEME/ONC OVERVIEW: 1. Metastatic squamous cell carcinoma, possibly lung primary malignancy; BRAF V600E mutation+ -09/2019:   Diffuse lymphadenopathy within the neck, axilla, thorax and upper abdomen, numerous bilateral pulmonary nodules; prominent thyroid gland extending into mediastinum on CT  R supraclavicular LN bx, squamous cell carcinoma; BRAF V600E mutation+; PD-L1 pending   Extensive metastatic disease involving the LN's (neck, chest and abdomen), lungs, liver and axial/appendicular skeleton   Normal LVEF on echocardiogram  -Late 09/2019 - present: dabrafenib/trametinib    TREATMENT SUMMARY:  09/24/2019 - present: dabrafenib/trametinib    09/30/2019 - present: q30month Zometa (tentatively)  ASSESSMENT & PLAN:   Metastatic squamous cell carcinoma, possibly lung primary malignancy -I reinforced the rationale for BRAF/MEK inhibitors, as well as some of the potential toxicities -Labs reviewed and adequate, continue dabrafenib/trametinib -On exam, her midline neck mass appears to be improving in size.  She is tolerating treatment otherwise relatively well without significant toxicities.  -I will see her back in 2 weeks for labs and toxicity assessment.  -We will plan to obtain scans to assess disease response after being on the treatment for at least 2 months  -In addition, she will need periodic echocardiogram to monitor for any cardiotoxicities related to trametinib.   One month after therapy initiation (in late 10/2019), then q2-380monthinterval   Extensive bony metastases -Pain relatively mild; not requiring opioid  medications  -On q3m5monthometa and Ca-Vit D supplement  -If the pain worsens in the future, she can contact the clinic for opioid medication as needed  Normocytic anemia -Most likely anemia of chronic disease -Hgb 11.0 today, stable -Patient denies any symptoms of bleeding -Continue treatment as outlined above   Leukocytosis -Likely reactive in the setting of underlying malignancy as well as recent Covid vaccination  -WBC 39k with lymphocyte predominance, suggesting recent vaccination -Clinically, patient denies any symptoms of infection -Continue treatment as above   No orders of the defined types were placed in this encounter.  The total time spent in the encounter was 40 minutes, including face-to-face time with the patient, review of various tests results, order additional studies/medications, documentation, and coordination of care plan.   All questions were answered. The patient knows to call the clinic with any problems, questions or concerns. No barriers to learning was detected.  Return in 2 weeks for labs and clinic follow-up.   YanTish MenD 4/28/20213:16 PM  CHIEF COMPLAINT: "I am doing okay so far"  INTERVAL HISTORY: Ms. CroHartmannturns to clinic for follow-up of metastatic squamous cell carcinoma on dabrafenib/trametinib.  Patient started her medications on 09/24/2019, and so far has tolerated well without significant side effects.  She reports feeling "warm" periodically, but denies any fever.  She feels that neck mass is decreasing in size.  She had a permanent crown placed yesterday, but did not have any further dental surgery, such as extraction.  She denies any other complaint today.  REVIEW OF SYSTEMS:   Constitutional: ( - ) fevers, ( - )  chills , ( - ) night sweats Eyes: ( - ) blurriness of vision, ( - ) double vision, ( - ) watery eyes Ears, nose, mouth, throat, and face: ( - ) mucositis, ( - )  sore throat Respiratory: ( - ) cough, ( - ) dyspnea, ( - )  wheezes Cardiovascular: ( - ) palpitation, ( - ) chest discomfort, ( - ) lower extremity swelling Gastrointestinal:  ( - ) nausea, ( - ) heartburn, ( - ) change in bowel habits Skin: ( - ) abnormal skin rashes Lymphatics: ( - ) new lymphadenopathy, ( - ) easy bruising Neurological: ( - ) numbness, ( - ) tingling, ( - ) new weaknesses Behavioral/Psych: ( - ) mood change, ( - ) new changes  All other systems were reviewed with the patient and are negative.  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 Prominently enlarged thyroid gland extending into the superior mediastinum and suprasternal notch with heterogeneous enhancement and coarse calcifications in the setting of prominent cervical and mediastinal lymphadenopathy and lung nodules is concerning for malignancy. 2. Advanced cervical degenerative changes, with moderate to severe right neural foraminal narrowing at C3-4. 3. Chronic inflammatory changes of the right maxillary sinus.   07/17/2019 Imaging   CT chest: IMPRESSION: 1. Diffuse lymphadenopathy within the neck, axillary regions, mediastinum, hila, and upper abdomen. Differential would include metastatic disease versus lymphoma/leukemia. 2. Numerous bilateral pulmonary nodules consistent with metastatic disease. 3. Heterogeneous enlarged thyroid, nonspecific. Neoplasm not excluded. 4. Indeterminate hyperdensities within the splenic parenchyma, metastatic disease or lymphomatous involvement suspected.   08/19/2019 Pathology Results   FINAL MICROSCOPIC DIAGNOSIS:   A. LYMPH NODE, RIGHT SUPRACLAVICULAR, NEEDLE CORE BIOPSY:  - Squamous cell carcinoma, see comment  - Lymphoid tissue is not identified    COMMENT:   Immunohistochemical stains show that the tumor cells are positive for CK7, p63 and CK 5/6; and negative for CK20, TTF-1 and  SOX-10, consistent  with above diagnosis.  Findings may suggest a lymph node entirely  replaced by tumor.  Dr. Tresa Moore reviewed the case and concurs with the diagnosis.  Dr. Alain Marion was paged on 08/21/2019.    08/25/2019 Initial Diagnosis   Squamous cell carcinoma of head and neck ([place])   09/07/2019 Imaging   PET: IMPRESSION: 1. Overall, there has been mild progression of disease. 2. Persistent adenopathy noted within the neck, chest, abdomen, and pelvis. These are all intensely FDG avid. When compared with the previous exam lymph nodes are either stable or slightly increased in size in the interval. 3. Multifocal FDG avid pulmonary nodules are identified which are stable to increased in size in the interval. 4. Multifocal FDG avid liver metastasis are increased in size and number compared with previous exam. 5. Widespread FDG avid lytic bone metastases are noted involving the axial and appendicular skeleton. Lesions were not confidently identified on previous CT of the neck, chest and upper abdomen. 6. New moderate left pleural effusion 7.  Aortic Atherosclerosis (ICD10-I70.0).   09/16/2019 Cancer Staging   Staging form: Cervical Lymph Nodes and Unknown Primary Tumors of the Head and Neck, AJCC 8th Edition - Clinical: Stage IVC (cT0, cN2c, cM1) - Signed by Tish Men, MD on 09/16/2019     I have reviewed the past medical history, past surgical history, social history and family history with the patient and they are unchanged from previous note.  ALLERGIES:  has No Known Allergies.  MEDICATIONS:  Current Outpatient Medications  Medication Sig Dispense Refill  . amoxicillin (AMOXIL) 500 MG capsule Take 500 mg by mouth 2 (two) times daily.    . Cholecalciferol (  VITAMIN D3) 50 MCG (2000 UT) capsule Take 1 capsule (2,000 Units total) by mouth daily. (Patient taking differently: Take 2,000 Units by mouth 2 (two) times daily. ) 100 capsule 3  . dabrafenib mesylate (TAFINLAR) 75 MG capsule  Take 2 capsules (150 mg total) by mouth 2 (two) times daily. Take on an empty stomach 1 hour before or 2 hours after meals. 120 capsule 11  . diltiazem (CARDIZEM CD) 120 MG 24 hr capsule TAKE 1 CAPSULE BY MOUTH EVERY DAY (Patient taking differently: Take 120 mg by mouth daily. ) 90 capsule 3  . hydrochlorothiazide (HYDRODIURIL) 25 MG tablet TAKE 1 TABLET BY MOUTH EVERY DAY (Patient taking differently: Take 25 mg by mouth daily. ) 90 tablet 3  . KLOR-CON M10 10 MEQ tablet TAKE 1 TABLET (10 MEQ TOTAL) BY MOUTH 2 (TWO) TIMES DAILY. (Patient taking differently: Take 10 mEq by mouth 2 (two) times daily. ) 180 tablet 3  . lisinopril (ZESTRIL) 20 MG tablet Take 20 mg by mouth daily.    . MULTIPLE VITAMIN PO Take 1 tablet by mouth daily.     Marland Kitchen olmesartan (BENICAR) 20 MG tablet Take 1 tablet (20 mg total) by mouth daily. 30 tablet 11  . Potassium 75 MG TABS Take 10 mg by mouth 2 (two) times daily.    . trametinib dimethyl sulfoxide (MEKINIST) 2 MG tablet Take 1 tablet (2 mg total) by mouth daily. Take 1 hour before or 2 hours after a meal. Store refrigerated in original container. 30 tablet 11  . benzonatate (TESSALON) 200 MG capsule Take 1 capsule (200 mg total) by mouth 2 (two) times daily as needed for cough. (Patient not taking: Reported on 09/30/2019) 20 capsule 0  . promethazine-codeine (PHENERGAN WITH CODEINE) 6.25-10 MG/5ML syrup Take 5 mLs by mouth every 4 (four) hours as needed. (Patient not taking: Reported on 09/30/2019) 300 mL 0   No current facility-administered medications for this visit.    PHYSICAL EXAMINATION: ECOG PERFORMANCE STATUS: 1 - Symptomatic but completely ambulatory  Today's Vitals   09/30/19 1436 09/30/19 1438  BP: (!) 128/58   Pulse: 87   Resp: 18   Temp: 98 F (36.7 C)   TempSrc: Temporal   SpO2: 100%   Weight: 111 lb 4.8 oz (50.5 kg)   Height: 5' 4"  (1.626 m)   PainSc:  0-No pain   Body mass index is 19.1 kg/m.  Filed Weights   09/30/19 1436  Weight: 111 lb 4.8  oz (50.5 kg)    GENERAL: alert, no distress and comfortable SKIN: skin color, texture, turgor are normal, no rashes or significant lesions EYES: conjunctiva are pink and non-injected, sclera clear OROPHARYNX: no exudate, no erythema; lips, buccal mucosa, and tongue normal  NECK: supple, non-tender LYMPH:  Bulky midline neck mass improving in size, currently measuring ~2x2cm LUNGS: clear to auscultation with normal breathing effort HEART: regular rate & rhythm and no murmurs and no lower extremity edema ABDOMEN: soft, non-tender, non-distended, normal bowel sounds Musculoskeletal: no cyanosis of digits and no clubbing  PSYCH: alert & oriented x 3, fluent speech  LABORATORY DATA:  I have reviewed the data as listed    Component Value Date/Time   NA 140 09/30/2019 1419   K 3.9 09/30/2019 1419   CL 104 09/30/2019 1419   CO2 24 09/30/2019 1419   GLUCOSE 103 (H) 09/30/2019 1419   GLUCOSE 97 03/27/2006 1226   BUN 37 (H) 09/30/2019 1419   CREATININE 1.00 09/30/2019 1419   CALCIUM 9.2  09/30/2019 1419   PROT 6.4 (L) 09/30/2019 1419   ALBUMIN 2.8 (L) 09/30/2019 1419   AST 20 09/30/2019 1419   ALT 10 09/30/2019 1419   ALKPHOS 201 (H) 09/30/2019 1419   BILITOT 0.3 09/30/2019 1419   GFRNONAA 57 (L) 09/30/2019 1419   GFRAA >60 09/30/2019 1419    No results found for: SPEP, UPEP  Lab Results  Component Value Date   WBC 39.0 (H) 09/30/2019   NEUTROABS 10.0 (H) 09/30/2019   HGB 11.0 (L) 09/30/2019   HCT 34.8 (L) 09/30/2019   MCV 87.0 09/30/2019   PLT 458 (H) 09/30/2019      Chemistry      Component Value Date/Time   NA 140 09/30/2019 1419   K 3.9 09/30/2019 1419   CL 104 09/30/2019 1419   CO2 24 09/30/2019 1419   BUN 37 (H) 09/30/2019 1419   CREATININE 1.00 09/30/2019 1419      Component Value Date/Time   CALCIUM 9.2 09/30/2019 1419   ALKPHOS 201 (H) 09/30/2019 1419   AST 20 09/30/2019 1419   ALT 10 09/30/2019 1419   BILITOT 0.3 09/30/2019 1419       RADIOGRAPHIC  STUDIES: I have personally reviewed the radiological images as listed below and agreed with the findings in the report. NM PET Image Initial (PI) Skull Base To Thigh  Result Date: 09/07/2019 CLINICAL DATA:  Subsequent treatment strategy for non-small cell lung cancer. EXAM: NUCLEAR MEDICINE PET SKULL BASE TO THIGH TECHNIQUE: 6.4 mCi F-18 FDG was injected intravenously. Full-ring PET imaging was performed from the skull base to thigh after the radiotracer. CT data was obtained and used for attenuation correction and anatomic localization. Fasting blood glucose: 140 mg/dl COMPARISON:  07/16/2018 FINDINGS: Mediastinal blood pool activity: SUV max 2.07 Liver activity: SUV max NA NECK: Extensive, bilateral FDG avid cervical adenopathy identified. Index right cervical node measures 1.8 cm with SUV max 21, image 29/4. Previously this node measured 1.4 cm. Index left posterior cervical node measures 1.3 cm within SUV max of 8.9. Previously this measured 0.8 cm. Incidental CT findings: none CHEST: Extensive FDG avid bilateral supraclavicular lymph nodes identified. Index left supraclavicular lymph node measures 2 cm and has an SUV max of 15.8, image 42/4. Previously 1.6 cm Right supraclavicular node measures 2.0 cm within SUV max 11.4, image 44/4. Previously 1.7 cm. Extensive FDG avid mediastinal and bilateral hilar adenopathy. Index pre-vascular nodal mass on the left measures 3.2 cm and has an SUV max of 15.14. Previously this measured 3.0 cm. The index subcarinal node measures 2.2 cm within SUV max of 15.09. Previously this measured 2.3 cm. FDG avid mass involving the thyroid gland is identified. This is centered around the isthmus and extends into the right lobe measuring 5.3 cm in longest dimension with an SUV max of 30.5, image 48/4. This is compared with 5.2 cm previously. There is a new small to moderate left pleural effusion. Bilateral pulmonary nodules are identified: -medial left upper lobe lung nodule measures  1.6 cm within SUV max 14.5, image 28/8. Previously 0.7 cm -index right upper lobe lung nodule measures 1.6 cm within SUV max of 7.9, image 42/8. Previously this measured 0.7 cm. -index central left lower lobe lung nodule measures 1.5 cm with SUV max of 8.3, image 40/8. Previously this measured the same. Incidental CT findings: Aortic atherosclerosis. RCA and LAD coronary artery calcifications. ABDOMEN/PELVIS: FDG avid liver metastases identified. -Index lesion between segment 2 and 3 measures 2 cm with SUV max 22, image  107/4. Previously this lesion measured 1 cm. -Segment 2 liver lesion measures 2.4 cm with SUV max 30.3, image 101/4. New from previous exam. Multiple FDG avid abdominopelvic lymph nodes. -index left retroperitoneal node measures 1.5 cm within SUV max of 11.5. Previously this measured 0.8 cm Right common iliac lymph node measures 1.4 cm and has an SUV max of 12.2, image 142/4. Necrotic right external iliac node measures 2.3 cm with SUV max 11.8, image 165/4. Incidental CT findings: Aortic atherosclerosis. SKELETON: Extensive multifocal FDG avid lytic bone metastases identified. Lesions are too numerous to count Index lesion involving the C7 vertebra measures 1.1 cm with SUV max 10.2, image 2/8. Index lesion involving the T11 vertebra measures 1.9 cm and has an SUV max of 18, image 60/8. New from previous exam. Index lesion within the left acetabulum measures 2.5 cm and has an SUV max 21.9. Incidental CT findings: none IMPRESSION: 1. Overall, there has been mild progression of disease. 2. Persistent adenopathy noted within the neck, chest, abdomen, and pelvis. These are all intensely FDG avid. When compared with the previous exam lymph nodes are either stable or slightly increased in size in the interval. 3. Multifocal FDG avid pulmonary nodules are identified which are stable to increased in size in the interval. 4. Multifocal FDG avid liver metastasis are increased in size and number compared with  previous exam. 5. Widespread FDG avid lytic bone metastases are noted involving the axial and appendicular skeleton. Lesions were not confidently identified on previous CT of the neck, chest and upper abdomen. 6. New moderate left pleural effusion 7.  Aortic Atherosclerosis (ICD10-I70.0). Electronically Signed   By: Kerby Moors M.D.   On: 09/07/2019 16:08   ECHOCARDIOGRAM COMPLETE  Result Date: 09/24/2019    ECHOCARDIOGRAM REPORT   Patient Name:   Jillian Hartman Date of Exam: 09/24/2019 Medical Rec #:  [id number]          Height:       64.0 in Accession #:    [id number]         Weight:       114.9 lb Date of Birth:  December 18, 1950          BSA:          1.546 m Patient Age:    23 years           BP:           145/69 mmHg Patient Gender: F                  HR:           87 bpm. Exam Location:  Outpatient Procedure: 2D Echo, 3D Echo, Cardiac Doppler, Color Doppler and Strain Analysis Indications:    Z51.11 Encounter for antineoplastic chemotheraphy  History:        Patient has no prior history of Echocardiogram examinations.                 Risk Factors:Hypertension. Metastatic cancer. Edema.  Sonographer:    Roseanna Rainbow Referring Phys: [id number] [facility]  Sonographer Comments: Technically difficult study due to poor echo windows. Difficult study due to extremely thin body habitus. IMPRESSIONS  1. Normal LV systolic function; grade 1 diastolic dysfunction; mild LVH; GLS-20.6%.  2. Left ventricular ejection fraction, by estimation, is 60 to 65%. The left ventricle has normal function. The left ventricle has no regional wall motion abnormalities. There is mild left ventricular hypertrophy. Left ventricular diastolic parameters are consistent with Grade  I diastolic dysfunction (impaired relaxation).  3. Right ventricular systolic function is normal. The right ventricular size is normal. There is mildly elevated pulmonary artery systolic pressure.  4. The mitral valve is normal in structure. Trivial mitral valve  regurgitation. No evidence of mitral stenosis.  5. The aortic valve was not well visualized. Aortic valve regurgitation is not visualized. No aortic stenosis is present.  6. The inferior vena cava is normal in size with greater than 50% respiratory variability, suggesting right atrial pressure of 3 mmHg. FINDINGS  Left Ventricle: Left ventricular ejection fraction, by estimation, is 60 to 65%. The left ventricle has normal function. The left ventricle has no regional wall motion abnormalities. The left ventricular internal cavity size was normal in size. There is  mild left ventricular hypertrophy. Left ventricular diastolic parameters are consistent with Grade I diastolic dysfunction (impaired relaxation). Right Ventricle: The right ventricular size is normal.Right ventricular systolic function is normal. There is mildly elevated pulmonary artery systolic pressure. The tricuspid regurgitant velocity is 2.67 m/s, and with an assumed right atrial pressure of  3 mmHg, the estimated right ventricular systolic pressure is 52.4 mmHg. Left Atrium: Left atrial size was normal in size. Right Atrium: Right atrial size was normal in size. Pericardium: Trivial pericardial effusion is present. Mitral Valve: The mitral valve is normal in structure. Normal mobility of the mitral valve leaflets. Trivial mitral valve regurgitation. No evidence of mitral valve stenosis. Tricuspid Valve: The tricuspid valve is normal in structure. Tricuspid valve regurgitation is trivial. No evidence of tricuspid stenosis. Aortic Valve: The aortic valve was not well visualized. Aortic valve regurgitation is not visualized. No aortic stenosis is present. Pulmonic Valve: The pulmonic valve was not well visualized. Pulmonic valve regurgitation is not visualized. No evidence of pulmonic stenosis. Aorta: The aortic root is normal in size and structure. Venous: The inferior vena cava is normal in size with greater than 50% respiratory variability,  suggesting right atrial pressure of 3 mmHg. IAS/Shunts: No atrial level shunt detected by color flow Doppler. Additional Comments: Normal LV systolic function; grade 1 diastolic dysfunction; mild LVH; GLS-20.6%.  LEFT VENTRICLE PLAX 2D LVIDd:         3.10 cm     Diastology LVIDs:         2.10 cm     LV e' lateral:   6.86 cm/s LV PW:         1.30 cm     LV E/e' lateral: 9.2 LV IVS:        0.90 cm     LV e' medial:    6.96 cm/s LVOT diam:     1.80 cm     LV E/e' medial:  9.1 LV SV:         62 LV SV Index:   40 LVOT Area:     2.54 cm  LV Volumes (MOD) LV vol d, MOD A2C: 43.6 ml LV vol d, MOD A4C: 45.6 ml LV vol s, MOD A2C: 17.6 ml LV vol s, MOD A4C: 10.0 ml LV SV MOD A2C:     26.0 ml LV SV MOD A4C:     45.6 ml LV SV MOD BP:      33.3 ml RIGHT VENTRICLE             IVC RV S prime:     16.40 cm/s  IVC diam: 1.20 cm TAPSE (M-mode): 2.2 cm LEFT ATRIUM           Index  RIGHT ATRIUM          Index LA diam:      2.20 cm 1.42 cm/m  RA Area:     7.42 cm LA Vol (A2C): 9.8 ml  6.31 ml/m  RA Volume:   12.70 ml 8.22 ml/m LA Vol (A4C): 16.0 ml 10.35 ml/m  AORTIC VALVE LVOT Vmax:   137.00 cm/s LVOT Vmean:  94.300 cm/s LVOT VTI:    0.245 m  AORTA Ao Root diam: 2.90 cm MITRAL VALVE               TRICUSPID VALVE MV Area (PHT): 4.49 cm    TR Peak grad:   28.5 mmHg MV Decel Time: 169 msec    TR Vmax:        267.00 cm/s MV E velocity: 63.10 cm/s MV A velocity: 80.00 cm/s  SHUNTS MV E/A ratio:  0.79        Systemic VTI:  0.24 m                            Systemic Diam: 1.80 cm Kirk Ruths MD Electronically signed by Kirk Ruths MD Signature Date/Time: 09/24/2019/3:19:20 PM    Final

## 2019-10-01 ENCOUNTER — Encounter: Payer: Self-pay | Admitting: Hematology

## 2019-10-14 ENCOUNTER — Telehealth: Payer: Self-pay | Admitting: Hematology

## 2019-10-14 ENCOUNTER — Encounter: Payer: Self-pay | Admitting: Hematology

## 2019-10-14 ENCOUNTER — Inpatient Hospital Stay: Payer: BC Managed Care – PPO | Attending: Hematology | Admitting: Hematology

## 2019-10-14 ENCOUNTER — Inpatient Hospital Stay (HOSPITAL_BASED_OUTPATIENT_CLINIC_OR_DEPARTMENT_OTHER): Payer: BC Managed Care – PPO | Admitting: Internal Medicine

## 2019-10-14 ENCOUNTER — Other Ambulatory Visit: Payer: Self-pay

## 2019-10-14 VITALS — BP 144/55 | HR 85 | Temp 98.0°F | Resp 18 | Ht 64.0 in | Wt 111.8 lb

## 2019-10-14 DIAGNOSIS — C7951 Secondary malignant neoplasm of bone: Secondary | ICD-10-CM | POA: Diagnosis not present

## 2019-10-14 DIAGNOSIS — E876 Hypokalemia: Secondary | ICD-10-CM

## 2019-10-14 DIAGNOSIS — D72829 Elevated white blood cell count, unspecified: Secondary | ICD-10-CM | POA: Insufficient documentation

## 2019-10-14 DIAGNOSIS — D649 Anemia, unspecified: Secondary | ICD-10-CM | POA: Insufficient documentation

## 2019-10-14 DIAGNOSIS — R748 Abnormal levels of other serum enzymes: Secondary | ICD-10-CM | POA: Diagnosis not present

## 2019-10-14 DIAGNOSIS — C7800 Secondary malignant neoplasm of unspecified lung: Secondary | ICD-10-CM | POA: Insufficient documentation

## 2019-10-14 DIAGNOSIS — C787 Secondary malignant neoplasm of liver and intrahepatic bile duct: Secondary | ICD-10-CM | POA: Diagnosis not present

## 2019-10-14 DIAGNOSIS — Z79899 Other long term (current) drug therapy: Secondary | ICD-10-CM | POA: Diagnosis not present

## 2019-10-14 DIAGNOSIS — C801 Malignant (primary) neoplasm, unspecified: Secondary | ICD-10-CM

## 2019-10-14 DIAGNOSIS — C778 Secondary and unspecified malignant neoplasm of lymph nodes of multiple regions: Secondary | ICD-10-CM | POA: Insufficient documentation

## 2019-10-14 DIAGNOSIS — C7989 Secondary malignant neoplasm of other specified sites: Secondary | ICD-10-CM

## 2019-10-14 LAB — CMP (CANCER CENTER ONLY)
ALT: 27 U/L (ref 0–44)
AST: 38 U/L (ref 15–41)
Albumin: 2.5 g/dL — ABNORMAL LOW (ref 3.5–5.0)
Alkaline Phosphatase: 494 U/L — ABNORMAL HIGH (ref 38–126)
Anion gap: 9 (ref 5–15)
BUN: 19 mg/dL (ref 8–23)
CO2: 27 mmol/L (ref 22–32)
Calcium: 8.4 mg/dL — ABNORMAL LOW (ref 8.9–10.3)
Chloride: 98 mmol/L (ref 98–111)
Creatinine: 0.79 mg/dL (ref 0.44–1.00)
GFR, Est AFR Am: >60 mL/min (ref >60)
GFR, Estimated: >60 mL/min (ref >60)
Glucose, Bld: 111 mg/dL — ABNORMAL HIGH (ref 70–99)
Potassium: 3.2 mmol/L — ABNORMAL LOW (ref 3.5–5.1)
Sodium: 134 mmol/L — ABNORMAL LOW (ref 135–145)
Total Bilirubin: 0.3 mg/dL (ref 0.3–1.2)
Total Protein: 6 g/dL — ABNORMAL LOW (ref 6.5–8.1)

## 2019-10-14 LAB — CBC WITH DIFFERENTIAL (CANCER CENTER ONLY)
Abs Immature Granulocytes: 0.04 10*3/uL (ref 0.00–0.07)
Basophils Absolute: 0.1 10*3/uL (ref 0.0–0.1)
Basophils Relative: 1 %
Eosinophils Absolute: 0.1 10*3/uL (ref 0.0–0.5)
Eosinophils Relative: 1 %
HCT: 34.7 % — ABNORMAL LOW (ref 36.0–46.0)
Hemoglobin: 11 g/dL — ABNORMAL LOW (ref 12.0–15.0)
Immature Granulocytes: 0 %
Lymphocytes Relative: 59 %
Lymphs Abs: 8 10*3/uL — ABNORMAL HIGH (ref 0.7–4.0)
MCH: 28.6 pg (ref 26.0–34.0)
MCHC: 31.7 g/dL (ref 30.0–36.0)
MCV: 90.1 fL (ref 80.0–100.0)
Monocytes Absolute: 0.7 10*3/uL (ref 0.1–1.0)
Monocytes Relative: 5 %
Neutro Abs: 4.6 10*3/uL (ref 1.7–7.7)
Neutrophils Relative %: 34 %
Platelet Count: 222 10*3/uL (ref 150–400)
RBC: 3.85 MIL/uL — ABNORMAL LOW (ref 3.87–5.11)
RDW: 15.7 % — ABNORMAL HIGH (ref 11.5–15.5)
WBC Count: 13.5 10*3/uL — ABNORMAL HIGH (ref 4.0–10.5)
nRBC: 0 % (ref 0.0–0.2)

## 2019-10-14 MED ORDER — ONDANSETRON HCL 8 MG PO TABS: 8.0000 mg | ORAL_TABLET | Freq: Three times a day (TID) | ORAL | 3 refills | Status: AC | PRN

## 2019-10-14 NOTE — Progress Notes (Signed)
[facility] OFFICE PROGRESS NOTE  Patient Care Team: Jillian Hartman, Jillian Lacks, MD as PCP - General (Internal Medicine) Jillian Men, MD as Consulting Physician (Hematology) Leota Sauers, RN (Inactive) as Oncology Nurse Navigator Malmfelt, Stephani Police, RN as Oncology Nurse Navigator (Oncology) Jillian Men, MD as Consulting Physician (Hematology)  HEME/ONC OVERVIEW: 1. Metastatic squamous cell carcinoma, possibly lung primary malignancy; BRAF V600E mutation+ -09/2019:   Diffuse lymphadenopathy within the neck, axilla, thorax and upper abdomen, numerous bilateral pulmonary nodules; prominent thyroid gland extending into mediastinum on CT  R supraclavicular LN bx, squamous cell carcinoma; BRAF V600E mutation+; PD-L1 pending   Extensive metastatic disease involving the LN's (neck, chest and abdomen), lungs, liver and axial/appendicular skeleton   Normal LVEF on echocardiogram  -Late 09/2019 - present: dabrafenib/trametinib    TREATMENT SUMMARY:  09/24/2019 - present: dabrafenib/trametinib    09/30/2019 - present: q44month Zometa  ASSESSMENT & PLAN:   Metastatic squamous cell carcinoma, possibly lung primary malignancy; BRAF V600E mutation+ -Currently on dabrafenib/tremetinib, tolerating it well  -Clinically, the midline neck mass has improved in size significantly.   -Labs reviewed and notable for rising alkaline phosphatase.  The package insert was reviewed and no dose adjustment is indicated at this time.  Continue dabrafenib/trametinib -We will plan to obtain scans to assess disease response after being on the treatment for at least 2 months  -In addition, she will need periodic echocardiogram to monitor for any cardiotoxicities related to trametinib.   One month after therapy initiation (in late 10/2019), then q2-358monthinterval  -On q3m53monthometa for extensive bony metastases  Elevated alkaline phosphatase -Likely due to medication side effect (dabrafenib) and  extensive bony metastases -S/p 1 dose of Zometa on 09/30/2019 -Grade 2 (< 5 x ULN); AST, ALT and Tbili normal -As she is clinically improving and other LFT's are normal, we will continue the current dosing regimen -We will repeat her labs in 2 weeks to monitor alkaline phosphatase trend   Normocytic anemia -Most likely anemia of chronic disease in the setting of metastatic disease  -Hgb 11.0 today, stable -Patient denies any symptoms of bleeding -Continue treatment as outlined above   Leukocytosis -Likely reactive in the setting of underlying malignancy -WBC 13.5k today, significantly improving  -Clinically, patient denies any symptoms of infection -Continue treatment as above   Hypokalemia -Likely secondary to electrolyte loss from diuretics -K 3.2 today; currently on KCl 38m56mID -I have instructed the patient to increase KCl to 20mE74mD -Repeat labs in 2 weeks to monitor electrolytes   No orders of the defined types were placed in this encounter.  The total time spent in the encounter was 40 minutes, including face-to-face time with the patient, review of various tests results, order additional studies/medications, documentation, and coordination of care plan.   All questions were answered. The patient knows to call the clinic with any problems, questions or concerns. No barriers to learning was detected.  Return in 2 weeks for labs and clinic follow-up.   J[facility]man  CHIEF COMPLAINT: "I am doing much better"  INTERVAL HISTORY: [facility] clinic for follow-up of metastatic squamous cell carcinoma on dabrafenib/trametinib.  Patient reports that in the past 2 weeks, she has felt much better, including her energy level.  She is able to exert herself without significant shortness of breath.  She had one episode of nausea and a loose stool about a week ago, which resolved spontaneously, but she has not had any recurrent GI  symptoms since then.  Her  appetite is still somewhat limited.  She denies any complaint today.  REVIEW OF SYSTEMS:   Constitutional: ( - ) fevers, ( - )  chills , ( - ) night sweats Eyes: ( - ) blurriness of vision, ( - ) double vision, ( - ) watery eyes Ears, nose, mouth, throat, and face: ( - ) mucositis, ( - ) sore throat Respiratory: ( - ) cough, ( - ) dyspnea, ( - ) wheezes Cardiovascular: ( - ) palpitation, ( - ) chest discomfort, ( - ) lower extremity swelling Gastrointestinal:  ( + ) nausea, ( - ) heartburn, ( + ) change in bowel habits Skin: ( - ) abnormal skin rashes Lymphatics: ( - ) new lymphadenopathy, ( - ) easy bruising Neurological: ( - ) numbness, ( - ) tingling, ( - ) new weaknesses Behavioral/Psych: ( - ) mood change, ( - ) new changes  All other systems were reviewed with the patient and are negative.  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 Prominently enlarged thyroid gland extending into the superior mediastinum and suprasternal notch with heterogeneous enhancement and coarse calcifications in the setting of prominent cervical and mediastinal lymphadenopathy and lung nodules is concerning for malignancy. 2. Advanced cervical degenerative changes, with moderate to severe right neural foraminal narrowing at C3-4. 3. Chronic inflammatory changes of the right maxillary sinus.   07/17/2019 Imaging   CT chest: IMPRESSION: 1. Diffuse lymphadenopathy within the neck, axillary regions, mediastinum, hila, and upper abdomen. Differential would include metastatic disease versus lymphoma/leukemia. 2. Numerous bilateral pulmonary nodules consistent with metastatic disease. 3. Heterogeneous enlarged thyroid, nonspecific. Neoplasm not excluded. 4. Indeterminate hyperdensities within the splenic parenchyma, metastatic disease or lymphomatous involvement  suspected.   08/19/2019 Pathology Results   FINAL MICROSCOPIC DIAGNOSIS:   A. LYMPH NODE, RIGHT SUPRACLAVICULAR, NEEDLE CORE BIOPSY:  - Squamous cell carcinoma, see comment  - Lymphoid tissue is not identified    COMMENT:   Immunohistochemical stains show that the tumor cells are positive for CK7, p63 and CK 5/6; and negative for CK20, TTF-1 and SOX-10, consistent  with above diagnosis.  Findings may suggest a lymph node entirely  replaced by tumor.  Dr. Tresa Moore reviewed the case and concurs with the diagnosis.  Dr. Alain Marion was paged on 08/21/2019.    08/25/2019 Initial Diagnosis   Squamous cell carcinoma of head and neck ([place])   09/07/2019 Imaging   PET: IMPRESSION: 1. Overall, there has been mild progression of disease. 2. Persistent adenopathy noted within the neck, chest, abdomen, and pelvis. These are all intensely FDG avid. When compared with the previous exam lymph nodes are either stable or slightly increased in size in the interval. 3. Multifocal FDG avid pulmonary nodules are identified which are stable to increased in size in the interval. 4. Multifocal FDG avid liver metastasis are increased in size and number compared with previous exam. 5. Widespread FDG avid lytic bone metastases are noted involving the axial and appendicular skeleton. Lesions were not confidently identified on previous CT of the neck, chest and upper abdomen. 6. New moderate left pleural effusion 7.  Aortic Atherosclerosis (ICD10-I70.0).   09/16/2019 Cancer Staging   Staging form: Cervical Lymph Nodes and Unknown Primary Tumors of the Head and Neck, AJCC 8th Edition - Clinical: Stage IVC (cT0, cN2c, cM1) - Signed by Jillian Men, MD on 09/16/2019  I have reviewed the past medical history, past surgical history, social history and family history with the patient and they are unchanged from previous note.  ALLERGIES:  has No Known Allergies.  MEDICATIONS:  Current Outpatient Medications   Medication Sig Dispense Refill  . Calcium Carb-Cholecalciferol (CALCIUM 600/VITAMIN D3) 600-800 MG-UNIT TABS Take 1 tablet by mouth daily.    . benzonatate (TESSALON) 200 MG capsule Take 1 capsule (200 mg total) by mouth 2 (two) times daily as needed for cough. (Patient not taking: Reported on 09/30/2019) 20 capsule 0  . dabrafenib mesylate (TAFINLAR) 75 MG capsule Take 2 capsules (150 mg total) by mouth 2 (two) times daily. Take on an empty stomach 1 hour before or 2 hours after meals. 120 capsule 11  . diltiazem (CARDIZEM CD) 120 MG 24 hr capsule TAKE 1 CAPSULE BY MOUTH EVERY DAY (Patient taking differently: Take 120 mg by mouth daily. ) 90 capsule 3  . hydrochlorothiazide (HYDRODIURIL) 25 MG tablet TAKE 1 TABLET BY MOUTH EVERY DAY (Patient taking differently: Take 25 mg by mouth daily. ) 90 tablet 3  . KLOR-CON M10 10 MEQ tablet TAKE 1 TABLET (10 MEQ TOTAL) BY MOUTH 2 (TWO) TIMES DAILY. (Patient taking differently: Take 10 mEq by mouth 2 (two) times daily. ) 180 tablet 3  . lisinopril (ZESTRIL) 20 MG tablet Take 20 mg by mouth daily.    . MULTIPLE VITAMIN PO Take 1 tablet by mouth daily.     Marland Kitchen olmesartan (BENICAR) 20 MG tablet Take 1 tablet (20 mg total) by mouth daily. 30 tablet 11  . ondansetron (ZOFRAN) 8 MG tablet Take 1 tablet (8 mg total) by mouth every 8 (eight) hours as needed for nausea or vomiting. 40 tablet 3  . Potassium 75 MG TABS Take 10 mg by mouth 2 (two) times daily.    . promethazine-codeine (PHENERGAN WITH CODEINE) 6.25-10 MG/5ML syrup Take 5 mLs by mouth every 4 (four) hours as needed. (Patient not taking: Reported on 09/30/2019) 300 mL 0  . trametinib dimethyl sulfoxide (MEKINIST) 2 MG tablet Take 1 tablet (2 mg total) by mouth daily. Take 1 hour before or 2 hours after a meal. Store refrigerated in original container. 30 tablet 11   No current facility-administered medications for this visit.    PHYSICAL EXAMINATION: ECOG PERFORMANCE STATUS: 1 - Symptomatic but completely  ambulatory  Today's Vitals   10/14/19 1506 10/14/19 1507 10/14/19 1512  BP: (!) 157/71 (!) 144/55   Pulse: 85    Resp: 18    Temp: 98 F (36.7 C)    TempSrc: Temporal    SpO2: 100%    Weight: 111 lb 12.8 oz (50.7 kg)    Height: 5' 4"  (1.626 m)    PainSc:   0-No pain   Body mass index is 19.19 kg/m.  Filed Weights   10/14/19 1506  Weight: 111 lb 12.8 oz (50.7 kg)    GENERAL: alert, no distress and comfortable SKIN: skin color, texture, turgor are normal, no rashes or significant lesions EYES: conjunctiva are pink and non-injected, sclera clear OROPHARYNX: no exudate, no erythema; lips, buccal mucosa, and tongue normal  NECK: supple, non-tender LYMPH:  Improving midline cervical LN disease (~1.5 x 1.5cm) LUNGS: clear to auscultation with normal breathing effort HEART: regular rate & rhythm and no murmurs and no lower extremity edema ABDOMEN: soft, non-tender, non-distended, normal bowel sounds Musculoskeletal: no cyanosis of digits and no clubbing  PSYCH: alert & oriented x 3, fluent speech  LABORATORY DATA:  I have reviewed the data as listed    Component Value Date/Time   NA 134 (L) 10/14/2019 1439   K 3.2 (L) 10/14/2019 1439   CL 98 10/14/2019 1439   CO2 27 10/14/2019 1439   GLUCOSE 111 (H) 10/14/2019 1439   GLUCOSE 97 03/27/2006 1226   BUN 19 10/14/2019 1439   CREATININE 0.79 10/14/2019 1439   CALCIUM 8.4 (L) 10/14/2019 1439   PROT 6.0 (L) 10/14/2019 1439   ALBUMIN 2.5 (L) 10/14/2019 1439   AST 38 10/14/2019 1439   ALT 27 10/14/2019 1439   ALKPHOS 494 (H) 10/14/2019 1439   BILITOT 0.3 10/14/2019 1439   GFRNONAA >60 10/14/2019 1439   GFRAA >60 10/14/2019 1439    No results found for: SPEP, UPEP  Lab Results  Component Value Date   WBC 13.5 (H) 10/14/2019   NEUTROABS 4.6 10/14/2019   HGB 11.0 (L) 10/14/2019   HCT 34.7 (L) 10/14/2019   MCV 90.1 10/14/2019   PLT 222 10/14/2019      Chemistry      Component Value Date/Time   NA 134 (L) 10/14/2019  1439   K 3.2 (L) 10/14/2019 1439   CL 98 10/14/2019 1439   CO2 27 10/14/2019 1439   BUN 19 10/14/2019 1439   CREATININE 0.79 10/14/2019 1439      Component Value Date/Time   CALCIUM 8.4 (L) 10/14/2019 1439   ALKPHOS 494 (H) 10/14/2019 1439   AST 38 10/14/2019 1439   ALT 27 10/14/2019 1439   BILITOT 0.3 10/14/2019 1439       RADIOGRAPHIC STUDIES: I have personally reviewed the radiological images as listed below and agreed with the findings in the report. ECHOCARDIOGRAM COMPLETE  Result Date: 09/24/2019    ECHOCARDIOGRAM REPORT   Patient Name:   Jillian Hartman Date of Exam: 09/24/2019 Medical Rec #:  [id number]          Height:       64.0 in Accession #:    [id number]         Weight:       114.9 lb Date of Birth:  11-03-1950          BSA:          1.546 m Patient Age:    35 years           BP:           145/69 mmHg Patient Gender: F                  HR:           87 bpm. Exam Location:  Outpatient Procedure: 2D Echo, 3D Echo, Cardiac Doppler, Color Doppler and Strain Analysis Indications:    Z51.11 Encounter for antineoplastic chemotheraphy  History:        Patient has no prior history of Echocardiogram examinations.                 Risk Factors:Hypertension. Metastatic cancer. Edema.  Sonographer:    Roseanna Rainbow Referring Phys: [id number] [facility]  Sonographer Comments: Technically difficult study due to poor echo windows. Difficult study due to extremely thin body habitus. IMPRESSIONS  1. Normal LV systolic function; grade 1 diastolic dysfunction; mild LVH; GLS-20.6%.  2. Left ventricular ejection fraction, by estimation, is 60 to 65%. The left ventricle has normal function. The left ventricle has no regional wall motion abnormalities. There is mild left ventricular hypertrophy. Left ventricular diastolic parameters are consistent with Grade I diastolic  dysfunction (impaired relaxation).  3. Right ventricular systolic function is normal. The right ventricular size is normal. There is mildly  elevated pulmonary artery systolic pressure.  4. The mitral valve is normal in structure. Trivial mitral valve regurgitation. No evidence of mitral stenosis.  5. The aortic valve was not well visualized. Aortic valve regurgitation is not visualized. No aortic stenosis is present.  6. The inferior vena cava is normal in size with greater than 50% respiratory variability, suggesting right atrial pressure of 3 mmHg. FINDINGS  Left Ventricle: Left ventricular ejection fraction, by estimation, is 60 to 65%. The left ventricle has normal function. The left ventricle has no regional wall motion abnormalities. The left ventricular internal cavity size was normal in size. There is  mild left ventricular hypertrophy. Left ventricular diastolic parameters are consistent with Grade I diastolic dysfunction (impaired relaxation). Right Ventricle: The right ventricular size is normal.Right ventricular systolic function is normal. There is mildly elevated pulmonary artery systolic pressure. The tricuspid regurgitant velocity is 2.67 m/s, and with an assumed right atrial pressure of  3 mmHg, the estimated right ventricular systolic pressure is 16.1 mmHg. Left Atrium: Left atrial size was normal in size. Right Atrium: Right atrial size was normal in size. Pericardium: Trivial pericardial effusion is present. Mitral Valve: The mitral valve is normal in structure. Normal mobility of the mitral valve leaflets. Trivial mitral valve regurgitation. No evidence of mitral valve stenosis. Tricuspid Valve: The tricuspid valve is normal in structure. Tricuspid valve regurgitation is trivial. No evidence of tricuspid stenosis. Aortic Valve: The aortic valve was not well visualized. Aortic valve regurgitation is not visualized. No aortic stenosis is present. Pulmonic Valve: The pulmonic valve was not well visualized. Pulmonic valve regurgitation is not visualized. No evidence of pulmonic stenosis. Aorta: The aortic root is normal in size and  structure. Venous: The inferior vena cava is normal in size with greater than 50% respiratory variability, suggesting right atrial pressure of 3 mmHg. IAS/Shunts: No atrial level shunt detected by color flow Doppler. Additional Comments: Normal LV systolic function; grade 1 diastolic dysfunction; mild LVH; GLS-20.6%.  LEFT VENTRICLE PLAX 2D LVIDd:         3.10 cm     Diastology LVIDs:         2.10 cm     LV e' lateral:   6.86 cm/s LV PW:         1.30 cm     LV E/e' lateral: 9.2 LV IVS:        0.90 cm     LV e' medial:    6.96 cm/s LVOT diam:     1.80 cm     LV E/e' medial:  9.1 LV SV:         62 LV SV Index:   40 LVOT Area:     2.54 cm  LV Volumes (MOD) LV vol d, MOD A2C: 43.6 ml LV vol d, MOD A4C: 45.6 ml LV vol s, MOD A2C: 17.6 ml LV vol s, MOD A4C: 10.0 ml LV SV MOD A2C:     26.0 ml LV SV MOD A4C:     45.6 ml LV SV MOD BP:      33.3 ml RIGHT VENTRICLE             IVC RV S prime:     16.40 cm/s  IVC diam: 1.20 cm TAPSE (M-mode): 2.2 cm LEFT ATRIUM           Index  RIGHT ATRIUM          Index LA diam:      2.20 cm 1.42 cm/m  RA Area:     7.42 cm LA Vol (A2C): 9.8 ml  6.31 ml/m  RA Volume:   12.70 ml 8.22 ml/m LA Vol (A4C): 16.0 ml 10.35 ml/m  AORTIC VALVE LVOT Vmax:   137.00 cm/s LVOT Vmean:  94.300 cm/s LVOT VTI:    0.245 m  AORTA Ao Root diam: 2.90 cm MITRAL VALVE               TRICUSPID VALVE MV Area (PHT): 4.49 cm    TR Peak grad:   28.5 mmHg MV Decel Time: 169 msec    TR Vmax:        267.00 cm/s MV E velocity: 63.10 cm/s MV A velocity: 80.00 cm/s  SHUNTS MV E/A ratio:  0.79        Systemic VTI:  0.24 m                            Systemic Diam: 1.80 cm Kirk Ruths MD Electronically signed by Kirk Ruths MD Signature Date/Time: 09/24/2019/3:19:20 Hartman    Final

## 2019-10-14 NOTE — Telephone Encounter (Signed)
Scheduled per los. Gave avs and calendar  

## 2019-10-19 MED FILL — MEKINIST 2 MG TAB: 2 | 30 days supply | Qty: 30 | Fill #1

## 2019-10-19 MED FILL — TAFINLAR 75 MG CAPSULE: 75 | 30 days supply | Qty: 120 | Fill #1

## 2019-10-27 ENCOUNTER — Inpatient Hospital Stay: Payer: BC Managed Care – PPO | Admitting: Internal Medicine

## 2019-10-27 ENCOUNTER — Encounter: Payer: Self-pay | Admitting: Internal Medicine

## 2019-10-27 ENCOUNTER — Inpatient Hospital Stay: Payer: BC Managed Care – PPO

## 2019-10-27 ENCOUNTER — Other Ambulatory Visit: Payer: Self-pay

## 2019-10-27 VITALS — BP 126/59 | HR 95 | Temp 97.5°F | Resp 18 | Ht 64.0 in | Wt 110.8 lb

## 2019-10-27 DIAGNOSIS — C801 Malignant (primary) neoplasm, unspecified: Secondary | ICD-10-CM | POA: Diagnosis not present

## 2019-10-27 DIAGNOSIS — D649 Anemia, unspecified: Secondary | ICD-10-CM | POA: Diagnosis not present

## 2019-10-27 DIAGNOSIS — D72829 Elevated white blood cell count, unspecified: Secondary | ICD-10-CM | POA: Diagnosis not present

## 2019-10-27 DIAGNOSIS — C7951 Secondary malignant neoplasm of bone: Secondary | ICD-10-CM | POA: Diagnosis not present

## 2019-10-27 DIAGNOSIS — C3490 Malignant neoplasm of unspecified part of unspecified bronchus or lung: Secondary | ICD-10-CM | POA: Diagnosis not present

## 2019-10-27 DIAGNOSIS — C7989 Secondary malignant neoplasm of other specified sites: Secondary | ICD-10-CM

## 2019-10-27 DIAGNOSIS — Z79899 Other long term (current) drug therapy: Secondary | ICD-10-CM | POA: Diagnosis not present

## 2019-10-27 DIAGNOSIS — I10 Essential (primary) hypertension: Secondary | ICD-10-CM | POA: Diagnosis not present

## 2019-10-27 DIAGNOSIS — Z5111 Encounter for antineoplastic chemotherapy: Secondary | ICD-10-CM

## 2019-10-27 DIAGNOSIS — C787 Secondary malignant neoplasm of liver and intrahepatic bile duct: Secondary | ICD-10-CM | POA: Diagnosis not present

## 2019-10-27 DIAGNOSIS — C778 Secondary and unspecified malignant neoplasm of lymph nodes of multiple regions: Secondary | ICD-10-CM | POA: Diagnosis not present

## 2019-10-27 DIAGNOSIS — C7800 Secondary malignant neoplasm of unspecified lung: Secondary | ICD-10-CM | POA: Diagnosis not present

## 2019-10-27 DIAGNOSIS — Z7189 Other specified counseling: Secondary | ICD-10-CM

## 2019-10-27 LAB — CMP (CANCER CENTER ONLY)
ALT: 20 U/L (ref 0–44)
AST: 19 U/L (ref 15–41)
Albumin: 2.7 g/dL — ABNORMAL LOW (ref 3.5–5.0)
Alkaline Phosphatase: 263 U/L — ABNORMAL HIGH (ref 38–126)
Anion gap: 9 (ref 5–15)
BUN: 21 mg/dL (ref 8–23)
CO2: 27 mmol/L (ref 22–32)
Calcium: 8 mg/dL — ABNORMAL LOW (ref 8.9–10.3)
Chloride: 99 mmol/L (ref 98–111)
Creatinine: 0.81 mg/dL (ref 0.44–1.00)
GFR, Est AFR Am: >60 mL/min (ref >60)
GFR, Estimated: >60 mL/min (ref >60)
Glucose, Bld: 156 mg/dL — ABNORMAL HIGH (ref 70–99)
Potassium: 3.1 mmol/L — ABNORMAL LOW (ref 3.5–5.1)
Sodium: 135 mmol/L (ref 135–145)
Total Bilirubin: 0.6 mg/dL (ref 0.3–1.2)
Total Protein: 6.1 g/dL — ABNORMAL LOW (ref 6.5–8.1)

## 2019-10-27 LAB — CBC WITH DIFFERENTIAL (CANCER CENTER ONLY)
Abs Immature Granulocytes: 0.07 10*3/uL (ref 0.00–0.07)
Basophils Absolute: 0.1 10*3/uL (ref 0.0–0.1)
Basophils Relative: 0 %
Eosinophils Absolute: 0.1 10*3/uL (ref 0.0–0.5)
Eosinophils Relative: 1 %
HCT: 33.2 % — ABNORMAL LOW (ref 36.0–46.0)
Hemoglobin: 10.6 g/dL — ABNORMAL LOW (ref 12.0–15.0)
Immature Granulocytes: 0 %
Lymphocytes Relative: 35 %
Lymphs Abs: 6.1 10*3/uL — ABNORMAL HIGH (ref 0.7–4.0)
MCH: 29.1 pg (ref 26.0–34.0)
MCHC: 31.9 g/dL (ref 30.0–36.0)
MCV: 91.2 fL (ref 80.0–100.0)
Monocytes Absolute: 0.9 10*3/uL (ref 0.1–1.0)
Monocytes Relative: 5 %
Neutro Abs: 10.5 10*3/uL — ABNORMAL HIGH (ref 1.7–7.7)
Neutrophils Relative %: 59 %
Platelet Count: 285 10*3/uL (ref 150–400)
RBC: 3.64 MIL/uL — ABNORMAL LOW (ref 3.87–5.11)
RDW: 18.8 % — ABNORMAL HIGH (ref 11.5–15.5)
WBC Count: 17.7 10*3/uL — ABNORMAL HIGH (ref 4.0–10.5)
nRBC: 0 % (ref 0.0–0.2)

## 2019-10-27 NOTE — Progress Notes (Signed)
[place] [phone]   [phone]  OFFICE PROGRESS NOTE  Plotnikov, [name], MD [place] [address]  DIAGNOSIS: Stage IV (T3, N3, M1c) non-small cell lung cancer, squamous cell carcinoma with positive BRAF V600E mutation and negative PD-L1 expression diagnosed in March 2021 and presented with diffuse lymphadenopathy within the neck, axilla, thorax, upper abdomen in addition to numerous bilateral pulmonary nodules and a prominent thyroid gland extending into the mediastinum as well as multiple metastatic liver disease and widespread bone metastasis.  PRIOR THERAPY: None  CURRENT THERAPY: Treatment with targeted therapy with dabrafenib 150 mg p.o. twice daily in addition to trametinib 2 mg p.o. daily.  First dose started status post September 22, 2019.   INTERVAL HISTORY: Jillian Hartman 69 y.o. female returns to the clinic today for follow-up visit accompanied by her son.  The patient is a previous patient of Dr. Maylon Peppers who is here today to establish care with me after he moved to a different practice.  The patient is feeling fine today with no concerning complaints except for fatigue and few episodes of diarrhea.  She has no current chest pain, shortness of breath, cough or hemoptysis.  She denied having any fever or chills.  She has no nausea, vomiting but occasional diarrhea with no hemoptysis.  She denied having any recent weight loss or night sweats.  She has no headache or visual changes.  The patient has been tolerating her treatment with dabrafenib and trametinib fairly well.  She was noticing previous blood work by Dr. Maylon Peppers to have elevated alkaline phosphatase but he continued her on the treatment with no interruption.  She is here today for reevaluation and repeat blood work.  MEDICAL HISTORY: Past Medical History:  Diagnosis Date  . Borderline hyperlipidemia   . Hypertension   . Tonsillitis 12/29/2011  . Varicose vein       ALLERGIES:  has No Known Allergies.  MEDICATIONS:  Current Outpatient Medications  Medication Sig Dispense Refill  . benzonatate (TESSALON) 200 MG capsule Take 1 capsule (200 mg total) by mouth 2 (two) times daily as needed for cough. (Patient not taking: Reported on 09/30/2019) 20 capsule 0  . Calcium Carb-Cholecalciferol (CALCIUM 600/VITAMIN D3) 600-800 MG-UNIT TABS Take 1 tablet by mouth daily.    Marland Kitchen diltiazem (CARDIZEM CD) 120 MG 24 hr capsule TAKE 1 CAPSULE BY MOUTH EVERY DAY (Patient taking differently: Take 120 mg by mouth daily. ) 90 capsule 3  . hydrochlorothiazide (HYDRODIURIL) 25 MG tablet TAKE 1 TABLET BY MOUTH EVERY DAY (Patient taking differently: Take 25 mg by mouth daily. ) 90 tablet 3  . KLOR-CON M10 10 MEQ tablet TAKE 1 TABLET (10 MEQ TOTAL) BY MOUTH 2 (TWO) TIMES DAILY. (Patient taking differently: Take 10 mEq by mouth 2 (two) times daily. ) 180 tablet 3  . lisinopril (ZESTRIL) 20 MG tablet Take 20 mg by mouth daily.    . MULTIPLE VITAMIN PO Take 1 tablet by mouth daily.     Marland Kitchen olmesartan (BENICAR) 20 MG tablet Take 1 tablet (20 mg total) by mouth daily. 30 tablet 11  . ondansetron (ZOFRAN) 8 MG tablet Take 1 tablet (8 mg total) by mouth every 8 (eight) hours as needed for nausea or vomiting. 40 tablet 3  . Potassium 75 MG TABS Take 10 mg by mouth 2 (two) times daily.    . promethazine-codeine (PHENERGAN WITH CODEINE) 6.25-10 MG/5ML syrup Take 5 mLs by mouth every 4 (  four) hours as needed. (Patient not taking: Reported on 09/30/2019) 300 mL 0   No current facility-administered medications for this visit.    SURGICAL HISTORY:  Past Surgical History:  Procedure Laterality Date  . ABDOMINAL HYSTERECTOMY    . CERVICAL CONIZATION W/BX     '77 after abnormal pap  . LAPAROSCOPY     '82 fertility work up    [place]:  Constitutional: positive for fatigue Eyes: negative Ears, nose, mouth, throat, and face: negative Respiratory: negative Cardiovascular:  negative Gastrointestinal: negative Genitourinary:negative Integument/breast: negative Hematologic/lymphatic: negative Musculoskeletal:negative Neurological: negative Behavioral/Psych: negative Endocrine: negative Allergic/Immunologic: negative   PHYSICAL EXAMINATION: General appearance: alert, cooperative, appears stated age and no distress Head: Normocephalic, without obvious abnormality, atraumatic Neck: no adenopathy, no JVD, supple, symmetrical, trachea midline and thyroid not enlarged, symmetric, no tenderness/mass/nodules Lymph nodes: Cervical, supraclavicular, and axillary nodes normal. Resp: clear to auscultation bilaterally Back: symmetric, no curvature. ROM normal. No CVA tenderness. Cardio: regular rate and rhythm, S1, S2 normal, no murmur, click, rub or gallop GI: soft, non-tender; bowel sounds normal; no masses,  no organomegaly Extremities: extremities normal, atraumatic, no cyanosis or edema Neurologic: Alert and oriented X 3, normal strength and tone. Normal symmetric reflexes. Normal coordination and gait  ECOG PERFORMANCE STATUS: 1 - Symptomatic but completely ambulatory  Blood pressure (!) 126/59, pulse 95, temperature (!) 97.5 F (36.4 C), temperature source Temporal, resp. rate 18, height 5' 4"  (1.626 m), weight 110 lb 12.8 oz (50.3 kg), SpO2 100 %.  LABORATORY DATA: Lab Results  Component Value Date   WBC 17.7 (H) 10/27/2019   HGB 10.6 (L) 10/27/2019   HCT 33.2 (L) 10/27/2019   MCV 91.2 10/27/2019   PLT 285 10/27/2019      Chemistry      Component Value Date/Time   NA 134 (L) 10/14/2019 1439   K 3.2 (L) 10/14/2019 1439   CL 98 10/14/2019 1439   CO2 27 10/14/2019 1439   BUN 19 10/14/2019 1439   CREATININE 0.79 10/14/2019 1439      Component Value Date/Time   CALCIUM 8.4 (L) 10/14/2019 1439   ALKPHOS 494 (H) 10/14/2019 1439   AST 38 10/14/2019 1439   ALT 27 10/14/2019 1439   BILITOT 0.3 10/14/2019 1439       RADIOGRAPHIC STUDIES: No  results found.  ASSESSMENT AND PLAN: This is a very pleasant 69 years old recently diagnosed with stage IV non-small cell lung cancer, squamous cell carcinoma with widely metastatic disease involving lymphadenopathy in the neck, chest, axilla, upper abdomen as well as liver and bone metastasis diagnosed in March 2021 with positive BRAF V600E mutations and negative PD-L1 expression. The patient is currently undergoing treatment with dabrafenib and trametinib status post 5 weeks of treatment.  The patient has been tolerating this treatment well. Her repeat blood work today is unremarkable and there is improvement in the alkaline phosphatase. I recommended for the patient to continue her current treatment with the same regimen for now. I will arrange for the patient to have repeat CT scan of the chest, abdomen pelvis as well as MRI of the brain before her upcoming visit with me in 3 weeks. She was advised to call immediately if she has any other concerning symptoms in the interval. The patient voices understanding of current disease status and treatment options and is in agreement with the current care plan.  All questions were answered. The patient knows to call the clinic with any problems, questions or concerns. We can  certainly see the patient much sooner if necessary.   The total time spent in the appointment was 50 minutes.  Disclaimer: This note was dictated with voice recognition software. Similar sounding words can inadvertently be transcribed and may not be corrected upon review.

## 2019-10-28 ENCOUNTER — Telehealth: Payer: Self-pay | Admitting: Internal Medicine

## 2019-10-28 ENCOUNTER — Other Ambulatory Visit: Payer: Self-pay | Admitting: Pharmacist

## 2019-10-28 NOTE — Telephone Encounter (Signed)
Scheduled appt per 5/25 los - called pt . Pt is aware of appt date and time.

## 2019-11-16 MED FILL — TAFINLAR 75 MG CAPSULE: 75 | 30 days supply | Qty: 120 | Fill #2

## 2019-11-16 MED FILL — MEKINIST 2 MG TAB: 2 | 30 days supply | Qty: 30 | Fill #2

## 2019-11-17 ENCOUNTER — Ambulatory Visit (HOSPITAL_COMMUNITY)
Admission: RE | Admit: 2019-11-17 | Discharge: 2019-11-17 | Disposition: A | Discharge status: Home / Self Care | Payer: BC Managed Care – PPO | Source: Ambulatory Visit | Attending: Internal Medicine | Admitting: Internal Medicine

## 2019-11-17 ENCOUNTER — Encounter (HOSPITAL_COMMUNITY): Payer: Self-pay

## 2019-11-17 ENCOUNTER — Other Ambulatory Visit: Payer: Self-pay

## 2019-11-17 ENCOUNTER — Other Ambulatory Visit: Payer: BC Managed Care – PPO

## 2019-11-17 ENCOUNTER — Inpatient Hospital Stay: Payer: BC Managed Care – PPO | Attending: Hematology

## 2019-11-17 DIAGNOSIS — N281 Cyst of kidney, acquired: Secondary | ICD-10-CM | POA: Diagnosis not present

## 2019-11-17 DIAGNOSIS — C778 Secondary and unspecified malignant neoplasm of lymph nodes of multiple regions: Secondary | ICD-10-CM | POA: Insufficient documentation

## 2019-11-17 DIAGNOSIS — R911 Solitary pulmonary nodule: Secondary | ICD-10-CM | POA: Diagnosis not present

## 2019-11-17 DIAGNOSIS — Z79899 Other long term (current) drug therapy: Secondary | ICD-10-CM | POA: Insufficient documentation

## 2019-11-17 DIAGNOSIS — C3490 Malignant neoplasm of unspecified part of unspecified bronchus or lung: Secondary | ICD-10-CM | POA: Insufficient documentation

## 2019-11-17 DIAGNOSIS — C7951 Secondary malignant neoplasm of bone: Secondary | ICD-10-CM | POA: Insufficient documentation

## 2019-11-17 DIAGNOSIS — C787 Secondary malignant neoplasm of liver and intrahepatic bile duct: Secondary | ICD-10-CM | POA: Diagnosis not present

## 2019-11-17 DIAGNOSIS — K8689 Other specified diseases of pancreas: Secondary | ICD-10-CM | POA: Diagnosis not present

## 2019-11-17 HISTORY — DX: Malignant (primary) neoplasm, unspecified: C80.1

## 2019-11-17 LAB — CBC WITH DIFFERENTIAL (CANCER CENTER ONLY)
Abs Immature Granulocytes: 0.03 10*3/uL (ref 0.00–0.07)
Basophils Absolute: 0.1 10*3/uL (ref 0.0–0.1)
Basophils Relative: 1 %
Eosinophils Absolute: 0.1 10*3/uL (ref 0.0–0.5)
Eosinophils Relative: 1 %
HCT: 36.9 % (ref 36.0–46.0)
Hemoglobin: 11.8 g/dL — ABNORMAL LOW (ref 12.0–15.0)
Immature Granulocytes: 0 %
Lymphocytes Relative: 57 %
Lymphs Abs: 6 10*3/uL — ABNORMAL HIGH (ref 0.7–4.0)
MCH: 29.7 pg (ref 26.0–34.0)
MCHC: 32 g/dL (ref 30.0–36.0)
MCV: 92.9 fL (ref 80.0–100.0)
Monocytes Absolute: 0.5 10*3/uL (ref 0.1–1.0)
Monocytes Relative: 5 %
Neutro Abs: 3.8 10*3/uL (ref 1.7–7.7)
Neutrophils Relative %: 36 %
Platelet Count: 222 10*3/uL (ref 150–400)
RBC: 3.97 MIL/uL (ref 3.87–5.11)
RDW: 18.4 % — ABNORMAL HIGH (ref 11.5–15.5)
WBC Count: 10.5 10*3/uL (ref 4.0–10.5)
nRBC: 0 % (ref 0.0–0.2)

## 2019-11-17 LAB — CMP (CANCER CENTER ONLY)
ALT: 21 U/L (ref 0–44)
AST: 28 U/L (ref 15–41)
Albumin: 3.1 g/dL — ABNORMAL LOW (ref 3.5–5.0)
Alkaline Phosphatase: 170 U/L — ABNORMAL HIGH (ref 38–126)
Anion gap: 8 (ref 5–15)
BUN: 33 mg/dL — ABNORMAL HIGH (ref 8–23)
CO2: 28 mmol/L (ref 22–32)
Calcium: 9.2 mg/dL (ref 8.9–10.3)
Chloride: 102 mmol/L (ref 98–111)
Creatinine: 0.81 mg/dL (ref 0.44–1.00)
GFR, Est AFR Am: >60 mL/min (ref >60)
GFR, Estimated: >60 mL/min (ref >60)
Glucose, Bld: 103 mg/dL — ABNORMAL HIGH (ref 70–99)
Potassium: 3.7 mmol/L (ref 3.5–5.1)
Sodium: 138 mmol/L (ref 135–145)
Total Bilirubin: 0.3 mg/dL (ref 0.3–1.2)
Total Protein: 6.6 g/dL (ref 6.5–8.1)

## 2019-11-17 MED ORDER — GADOBUTROL 1 MMOL/ML IV SOLN
5.0000 mL | Freq: Once | INTRAVENOUS | Status: AC | PRN
  Administered 2019-11-17: 5 mL via INTRAVENOUS

## 2019-11-17 MED ORDER — SODIUM CHLORIDE (PF) 0.9 % IJ SOLN
INTRAMUSCULAR | Status: AC
  Filled 2019-11-17: qty 50

## 2019-11-17 MED ORDER — IOHEXOL 300 MG/ML  SOLN
100.0000 mL | Freq: Once | INTRAMUSCULAR | Status: AC | PRN
  Administered 2019-11-17: 100 mL via INTRAVENOUS

## 2019-11-19 ENCOUNTER — Inpatient Hospital Stay: Payer: BC Managed Care – PPO | Admitting: Internal Medicine

## 2019-11-19 ENCOUNTER — Encounter: Payer: Self-pay | Admitting: Internal Medicine

## 2019-11-19 ENCOUNTER — Other Ambulatory Visit: Payer: Self-pay

## 2019-11-19 VITALS — BP 125/72 | HR 98 | Temp 97.5°F | Resp 18 | Ht 64.0 in | Wt 117.3 lb

## 2019-11-19 DIAGNOSIS — I10 Essential (primary) hypertension: Secondary | ICD-10-CM | POA: Diagnosis not present

## 2019-11-19 DIAGNOSIS — C7951 Secondary malignant neoplasm of bone: Secondary | ICD-10-CM | POA: Diagnosis not present

## 2019-11-19 DIAGNOSIS — C801 Malignant (primary) neoplasm, unspecified: Secondary | ICD-10-CM | POA: Diagnosis not present

## 2019-11-19 DIAGNOSIS — Z79899 Other long term (current) drug therapy: Secondary | ICD-10-CM | POA: Diagnosis not present

## 2019-11-19 DIAGNOSIS — C787 Secondary malignant neoplasm of liver and intrahepatic bile duct: Secondary | ICD-10-CM | POA: Diagnosis not present

## 2019-11-19 DIAGNOSIS — C3490 Malignant neoplasm of unspecified part of unspecified bronchus or lung: Secondary | ICD-10-CM | POA: Diagnosis not present

## 2019-11-19 DIAGNOSIS — C778 Secondary and unspecified malignant neoplasm of lymph nodes of multiple regions: Secondary | ICD-10-CM | POA: Diagnosis not present

## 2019-11-19 DIAGNOSIS — Z5111 Encounter for antineoplastic chemotherapy: Secondary | ICD-10-CM | POA: Diagnosis not present

## 2019-11-19 DIAGNOSIS — C7989 Secondary malignant neoplasm of other specified sites: Secondary | ICD-10-CM | POA: Diagnosis not present

## 2019-11-19 NOTE — Progress Notes (Signed)
[place] [phone]   [phone]  OFFICE PROGRESS NOTE  Plotnikov, [name], MD [place] [address]  DIAGNOSIS: Stage IV (T3, N3, M1c) non-small cell lung cancer, squamous cell carcinoma with positive BRAF V600E mutation and negative PD-L1 expression diagnosed in March 2021 and presented with diffuse lymphadenopathy within the neck, axilla, thorax, upper abdomen in addition to numerous bilateral pulmonary nodules and a prominent thyroid gland extending into the mediastinum as well as multiple metastatic liver disease and widespread bone metastasis.  PRIOR THERAPY: None  CURRENT THERAPY: Treatment with targeted therapy with dabrafenib 150 mg p.o. twice daily in addition to trametinib 2 mg p.o. daily.  First dose started status post September 22, 2019.   INTERVAL HISTORY: Jillian Hartman 69 y.o. female returns to the clinic today for follow-up visit.  The patient is feeling fine today with no concerning complaints.  She has no chest pain, shortness breath, cough or hemoptysis.  She denied having any fever or chills.  She has no nausea, vomiting, diarrhea or constipation.  She has no headache or visual changes.  She has been tolerating her treatment with dabrafenib and trametinib fairly well.  She had repeat CT scan of the chest, abdomen pelvis as well as MRI of the brain performed recently and she is here for evaluation and discussion of her scan results.   MEDICAL HISTORY: Past Medical History:  Diagnosis Date   Borderline hyperlipidemia    Hypertension    NSCL Ca Dx'd 08/2019   Tonsillitis 12/29/2011   Varicose vein     ALLERGIES:  has No Known Allergies.  MEDICATIONS:  Current Outpatient Medications  Medication Sig Dispense Refill   Calcium Carb-Cholecalciferol (CALCIUM 600/VITAMIN D3) 600-800 MG-UNIT TABS Take 1 tablet by mouth daily.     dabrafenib mesylate (TAFINLAR) 75 MG capsule Take 150 mg by mouth 2 (two)  times daily. Take on an empty stomach 1 hour before or 2 hours after meals.     hydrochlorothiazide (HYDRODIURIL) 25 MG tablet TAKE 1 TABLET BY MOUTH EVERY DAY (Patient taking differently: Take 25 mg by mouth daily. ) 90 tablet 3   KLOR-CON M10 10 MEQ tablet TAKE 1 TABLET (10 MEQ TOTAL) BY MOUTH 2 (TWO) TIMES DAILY. (Patient taking differently: Take 10 mEq by mouth 2 (two) times daily. ) 180 tablet 3   lisinopril (ZESTRIL) 20 MG tablet Take 20 mg by mouth daily.     MULTIPLE VITAMIN PO Take 1 tablet by mouth daily.      olmesartan (BENICAR) 20 MG tablet Take 1 tablet (20 mg total) by mouth daily. 30 tablet 11   ondansetron (ZOFRAN) 8 MG tablet Take 1 tablet (8 mg total) by mouth every 8 (eight) hours as needed for nausea or vomiting. 40 tablet 3   promethazine-codeine (PHENERGAN WITH CODEINE) 6.25-10 MG/5ML syrup Take 5 mLs by mouth every 4 (four) hours as needed. 300 mL 0   trametinib dimethyl sulfoxide (MEKINIST) 2 MG tablet Take 2 mg by mouth daily. Take 1 hour before or 2 hours after a meal. Store refrigerated in original container.     diltiazem (CARDIZEM CD) 120 MG 24 hr capsule TAKE 1 CAPSULE BY MOUTH EVERY DAY (Patient not taking: Reported on 11/19/2019) 90 capsule 3   Potassium 75 MG TABS Take 10 mg by mouth 2 (two) times daily. (Patient not taking: Reported on 11/19/2019)     No current facility-administered medications for this visit.  SURGICAL HISTORY:  Past Surgical History:  Procedure Laterality Date   ABDOMINAL HYSTERECTOMY     CERVICAL CONIZATION W/BX     '77 after abnormal pap   LAPAROSCOPY     '82 fertility work up    REVIEW OF SYSTEMS:  Constitutional: negative Eyes: negative Ears, nose, mouth, throat, and face: negative Respiratory: negative Cardiovascular: negative Gastrointestinal: negative Genitourinary:negative Integument/breast: negative Hematologic/lymphatic: negative Musculoskeletal:negative Neurological: negative Behavioral/Psych:  negative Endocrine: negative Allergic/Immunologic: negative   PHYSICAL EXAMINATION: General appearance: alert, cooperative, appears stated age and no distress Head: Normocephalic, without obvious abnormality, atraumatic Neck: no adenopathy, no JVD, supple, symmetrical, trachea midline and thyroid not enlarged, symmetric, no tenderness/mass/nodules Lymph nodes: Cervical, supraclavicular, and axillary nodes normal. Resp: clear to auscultation bilaterally Back: symmetric, no curvature. ROM normal. No CVA tenderness. Cardio: regular rate and rhythm, S1, S2 normal, no murmur, click, rub or gallop GI: soft, non-tender; bowel sounds normal; no masses,  no organomegaly Extremities: extremities normal, atraumatic, no cyanosis or edema Neurologic: Alert and oriented X 3, normal strength and tone. Normal symmetric reflexes. Normal coordination and gait  ECOG PERFORMANCE STATUS: 1 - Symptomatic but completely ambulatory  Blood pressure 125/72, pulse 98, temperature (!) 97.5 F (36.4 C), temperature source Temporal, resp. rate 18, height 5' 4"  (1.626 m), weight 117 lb 4.8 oz (53.2 kg), SpO2 100 %.  LABORATORY DATA: Lab Results  Component Value Date   WBC 10.5 11/17/2019   HGB 11.8 (L) 11/17/2019   HCT 36.9 11/17/2019   MCV 92.9 11/17/2019   PLT 222 11/17/2019      Chemistry      Component Value Date/Time   NA 138 11/17/2019 1357   K 3.7 11/17/2019 1357   CL 102 11/17/2019 1357   CO2 28 11/17/2019 1357   BUN 33 (H) 11/17/2019 1357   CREATININE 0.81 11/17/2019 1357      Component Value Date/Time   CALCIUM 9.2 11/17/2019 1357   ALKPHOS 170 (H) 11/17/2019 1357   AST 28 11/17/2019 1357   ALT 21 11/17/2019 1357   BILITOT 0.3 11/17/2019 1357       RADIOGRAPHIC STUDIES: CT Chest W Contrast  Result Date: 11/17/2019 CLINICAL DATA:  Non-small-cell lung cancer diagnosed in March. Hysterectomy. Oral chemotherapy ongoing. 15 pound weight loss over 1 year. Cervical node biopsy 08/19/2019  demonstrating squamous cell carcinoma. EXAM: CT CHEST, ABDOMEN, AND PELVIS WITH CONTRAST TECHNIQUE: Multidetector CT imaging of the chest, abdomen and pelvis was performed following the standard protocol during bolus administration of intravenous contrast. CONTRAST:  166m OMNIPAQUE IOHEXOL 300 MG/ML  SOLN COMPARISON:  09/07/2019 PET.  Chest CT 07/17/2019. FINDINGS: CT CHEST FINDINGS Cardiovascular: Aortic atherosclerosis. Normal heart size, without pericardial effusion. Multivessel coronary artery atherosclerosis. No central pulmonary embolism, on this non-dedicated study. Mediastinum/Nodes: Thyroid enlargement and heterogeneity is chronic and nonspecific. A necrotic low jugular/supraclavicular node measures 2.1 cm on 08/02 versus 1.8 cm on the prior PET (when remeasured). Index right axillary node measures 1.2 cm on 24/2 versus 1.0 cm on the prior exam (when remeasured). Subcarinal nodal mass measures 1.9 x 3.3 cm on 34/2 versus 2.1 x 4.0 cm on the prior PET Lungs/Pleura: No pleural fluid. Macrolobulated right middle lobe pulmonary nodule measures 1.4 x 1.0 cm on 99/6 versus 1.5 x 0.9 cm on the 09/07/2019 PET (when remeasured). A superior segment right lower lobe 4 mm nodule on 67/6 has enlarged from 2 mm on the prior PET. Significant improvement to resolution of innumerable smaller pulmonary nodules on the 09/07/2019 PET. Musculoskeletal: New  sclerotic lesions at the site of lytic metastasis on the prior PET. Example within the sternal manubrium at 7 mm on 22/2. T11 moderate compression deformity is new since the prior diagnostic chest CT, pathologic. CT ABDOMEN PELVIS FINDINGS Hepatobiliary: Hypoattenuating segment 2 1.2 cm lesion on 56/2 is decreased (given comparison and noncontrast CT from PET) compared to 2.0 cm on 09/07/2019 (when remeasured). Anterior segment 3 lesion measures 1.0 cm today versus 2.0 cm on the prior PET. Normal gallbladder, without biliary ductal dilatation. Pancreas: Cystic lesion within  the pancreatic head versus an area of side branch duct ectasia, 7 mm on 68/2. Of doubtful clinical significance, given comorbidities. Spleen: Normal in size, without focal abnormality. Adrenals/Urinary Tract: Normal adrenal glands. Probable splenule positioned between the left adrenal gland and spleen including on 57/2. Upper pole right renal 3.2 cm cyst. Normal left kidney and urinary bladder. Stomach/Bowel: Normal stomach, without wall thickening. Normal colon and terminal ileum. Normal small bowel. Vascular/Lymphatic: Aortic atherosclerosis. Abdominal retroperitoneal adenopathy. Left periaortic node measures 1.2 cm on 67/2 versus 1.5 cm on the prior PET (when remeasured). Marked pelvic sidewall adenopathy, with a right external iliac index node measuring 2.6 cm on 105/2 versus 2.3 cm on the prior. A left external iliac node measures 2.2 cm on 104/2 versus 1.9 cm on the prior PET (when remeasured). Reproductive: Hysterectomy.  No adnexal mass. Other: No significant free fluid.  Mild pelvic floor laxity. Musculoskeletal: New sclerosis in the left ischial tuberosity, at the site of a lytic lesion on the prior PET. Example 116/2, measuring 1.1 cm. Similar findings within the left pubic bone, including on 114/2. New L5 lytic lesion on 91/5, without vertebral body height loss. IMPRESSION: 1. Primarily response to therapy since the PET of 09/07/2019, including decreased lung lesions, thoracoabdominal nodes, and liver lesions. 2. Progressive adenopathy within the pelvis, suggesting mixed response to therapy. 3. Overall, the appearance of the nodes is more typical of lymphoma than metastatic squamous cell carcinoma. However, a neck node was biopsied and positive for squamous cell carcinoma 08/19/2019. Therefore, this is presumably an atypical appearance of widespread metastatic squamous cell carcinoma. 4. Primarily healing of osseous metastasis, as evidenced by new areas of sclerosis. A new T11 compression deformity is  pathologic, without ventral canal encroachment. 5. Coronary artery atherosclerosis. Aortic Atherosclerosis (ICD10-I70.0). Electronically Signed   By: Abigail Miyamoto M.D.   On: 11/17/2019 19:59   MR BRAIN W WO CONTRAST  Result Date: 11/18/2019 CLINICAL DATA:  Non-small-cell lung cancer staging. EXAM: MRI HEAD WITHOUT AND WITH CONTRAST TECHNIQUE: Multiplanar, multiecho pulse sequences of the brain and surrounding structures were obtained without and with intravenous contrast. CONTRAST:  66m GADAVIST GADOBUTROL 1 MMOL/ML IV SOLN COMPARISON:  None. FINDINGS: Brain: Ventricle size and cerebral volume normal. Negative for acute infarct. Scattered small nonenhancing white matter hyperintensities bilaterally most consistent with chronic microvascular ischemia. Negative for hemorrhage or mass Postcontrast imaging demonstrates no enhancing metastatic deposits. Vascular: Normal arterial flow voids Skull and upper cervical spine: No focal skeletal lesion identified. Sinuses/Orbits: Extensive mucosal edema and fluid in the right maxillary sinus. Mild mucosal edema right ethmoid sinus. Remaining sinuses clear. Negative orbit Other: None IMPRESSION: Negative for metastatic disease to the brain Mild chronic microvascular ischemic change in the white matter. Electronically Signed   By: CFranchot GalloM.D.   On: 11/18/2019 13:38   CT Abdomen Pelvis W Contrast  Result Date: 11/17/2019 CLINICAL DATA:  Non-small-cell lung cancer diagnosed in March. Hysterectomy. Oral chemotherapy ongoing. 15 pound weight loss over  1 year. Cervical node biopsy 08/19/2019 demonstrating squamous cell carcinoma. EXAM: CT CHEST, ABDOMEN, AND PELVIS WITH CONTRAST TECHNIQUE: Multidetector CT imaging of the chest, abdomen and pelvis was performed following the standard protocol during bolus administration of intravenous contrast. CONTRAST:  167m OMNIPAQUE IOHEXOL 300 MG/ML  SOLN COMPARISON:  09/07/2019 PET.  Chest CT 07/17/2019. FINDINGS: CT CHEST  FINDINGS Cardiovascular: Aortic atherosclerosis. Normal heart size, without pericardial effusion. Multivessel coronary artery atherosclerosis. No central pulmonary embolism, on this non-dedicated study. Mediastinum/Nodes: Thyroid enlargement and heterogeneity is chronic and nonspecific. A necrotic low jugular/supraclavicular node measures 2.1 cm on 08/02 versus 1.8 cm on the prior PET (when remeasured). Index right axillary node measures 1.2 cm on 24/2 versus 1.0 cm on the prior exam (when remeasured). Subcarinal nodal mass measures 1.9 x 3.3 cm on 34/2 versus 2.1 x 4.0 cm on the prior PET Lungs/Pleura: No pleural fluid. Macrolobulated right middle lobe pulmonary nodule measures 1.4 x 1.0 cm on 99/6 versus 1.5 x 0.9 cm on the 09/07/2019 PET (when remeasured). A superior segment right lower lobe 4 mm nodule on 67/6 has enlarged from 2 mm on the prior PET. Significant improvement to resolution of innumerable smaller pulmonary nodules on the 09/07/2019 PET. Musculoskeletal: New sclerotic lesions at the site of lytic metastasis on the prior PET. Example within the sternal manubrium at 7 mm on 22/2. T11 moderate compression deformity is new since the prior diagnostic chest CT, pathologic. CT ABDOMEN PELVIS FINDINGS Hepatobiliary: Hypoattenuating segment 2 1.2 cm lesion on 56/2 is decreased (given comparison and noncontrast CT from PET) compared to 2.0 cm on 09/07/2019 (when remeasured). Anterior segment 3 lesion measures 1.0 cm today versus 2.0 cm on the prior PET. Normal gallbladder, without biliary ductal dilatation. Pancreas: Cystic lesion within the pancreatic head versus an area of side branch duct ectasia, 7 mm on 68/2. Of doubtful clinical significance, given comorbidities. Spleen: Normal in size, without focal abnormality. Adrenals/Urinary Tract: Normal adrenal glands. Probable splenule positioned between the left adrenal gland and spleen including on 57/2. Upper pole right renal 3.2 cm cyst. Normal left kidney  and urinary bladder. Stomach/Bowel: Normal stomach, without wall thickening. Normal colon and terminal ileum. Normal small bowel. Vascular/Lymphatic: Aortic atherosclerosis. Abdominal retroperitoneal adenopathy. Left periaortic node measures 1.2 cm on 67/2 versus 1.5 cm on the prior PET (when remeasured). Marked pelvic sidewall adenopathy, with a right external iliac index node measuring 2.6 cm on 105/2 versus 2.3 cm on the prior. A left external iliac node measures 2.2 cm on 104/2 versus 1.9 cm on the prior PET (when remeasured). Reproductive: Hysterectomy.  No adnexal mass. Other: No significant free fluid.  Mild pelvic floor laxity. Musculoskeletal: New sclerosis in the left ischial tuberosity, at the site of a lytic lesion on the prior PET. Example 116/2, measuring 1.1 cm. Similar findings within the left pubic bone, including on 114/2. New L5 lytic lesion on 91/5, without vertebral body height loss. IMPRESSION: 1. Primarily response to therapy since the PET of 09/07/2019, including decreased lung lesions, thoracoabdominal nodes, and liver lesions. 2. Progressive adenopathy within the pelvis, suggesting mixed response to therapy. 3. Overall, the appearance of the nodes is more typical of lymphoma than metastatic squamous cell carcinoma. However, a neck node was biopsied and positive for squamous cell carcinoma 08/19/2019. Therefore, this is presumably an atypical appearance of widespread metastatic squamous cell carcinoma. 4. Primarily healing of osseous metastasis, as evidenced by new areas of sclerosis. A new T11 compression deformity is pathologic, without ventral canal encroachment. 5. Coronary  artery atherosclerosis. Aortic Atherosclerosis (ICD10-I70.0). Electronically Signed   By: Abigail Miyamoto M.D.   On: 11/17/2019 19:59    ASSESSMENT AND PLAN: This is a very pleasant 69 years old recently diagnosed with stage IV non-small cell lung cancer, squamous cell carcinoma with widely metastatic disease  involving lymphadenopathy in the neck, chest, axilla, upper abdomen as well as liver and bone metastasis diagnosed in March 2021 with positive BRAF V600E mutations and negative PD-L1 expression. The patient is currently undergoing treatment with dabrafenib and trametinib status post 2 months of treatment.   The patient has been tolerating this treatment well with no concerning adverse effects. She had repeat CT scan of the chest, abdomen pelvis as well as MRI of the brain performed recently.  I personally and independently reviewed the scans and discussed the results with the patient today. Her MRI showed no concerning findings for metastatic disease to the brain. The CT scan of the chest, abdomen pelvis showed mixed response with improvement of her disease in the chest as well as the bone but there was enlarging lymphadenopathy within the pelvic area that will need close monitoring. I recommended for the patient to continue her current treatment with dabrafenib and trametinib with the same dose. I will see her back for follow-up visit in 1 months for evaluation and repeat blood work. We will continue to monitor her pelvic lymphadenopathy closely on the upcoming imaging studies. The patient was advised to call immediately if she has any concerning symptoms in the interval. The patient voices understanding of current disease status and treatment options and is in agreement with the current care plan.  All questions were answered. The patient knows to call the clinic with any problems, questions or concerns. We can certainly see the patient much sooner if necessary.   The total time spent in the appointment was 50 minutes.  Disclaimer: This note was dictated with voice recognition software. Similar sounding words can inadvertently be transcribed and may not be corrected upon review.

## 2019-11-20 ENCOUNTER — Encounter: Payer: Self-pay | Admitting: Internal Medicine

## 2019-11-25 ENCOUNTER — Inpatient Hospital Stay (HOSPITAL_BASED_OUTPATIENT_CLINIC_OR_DEPARTMENT_OTHER): Payer: BC Managed Care – PPO | Admitting: Medical

## 2019-11-25 ENCOUNTER — Other Ambulatory Visit: Payer: Self-pay

## 2019-11-25 VITALS — BP 116/64 | HR 98 | Temp 97.5°F | Resp 19 | Ht 64.0 in | Wt 116.1 lb

## 2019-11-25 DIAGNOSIS — C801 Malignant (primary) neoplasm, unspecified: Secondary | ICD-10-CM

## 2019-11-25 DIAGNOSIS — Z79899 Other long term (current) drug therapy: Secondary | ICD-10-CM | POA: Diagnosis not present

## 2019-11-25 DIAGNOSIS — R21 Rash and other nonspecific skin eruption: Secondary | ICD-10-CM | POA: Diagnosis not present

## 2019-11-25 DIAGNOSIS — C3490 Malignant neoplasm of unspecified part of unspecified bronchus or lung: Secondary | ICD-10-CM | POA: Diagnosis not present

## 2019-11-25 DIAGNOSIS — C7989 Secondary malignant neoplasm of other specified sites: Secondary | ICD-10-CM | POA: Diagnosis not present

## 2019-11-25 DIAGNOSIS — C778 Secondary and unspecified malignant neoplasm of lymph nodes of multiple regions: Secondary | ICD-10-CM | POA: Diagnosis not present

## 2019-11-25 DIAGNOSIS — C787 Secondary malignant neoplasm of liver and intrahepatic bile duct: Secondary | ICD-10-CM | POA: Diagnosis not present

## 2019-11-25 DIAGNOSIS — C7951 Secondary malignant neoplasm of bone: Secondary | ICD-10-CM | POA: Diagnosis not present

## 2019-11-25 MED ORDER — TRIAMCINOLONE ACETONIDE 0.1 % EX LOTN: 1.0000 "application " | TOPICAL_LOTION | Freq: Three times a day (TID) | CUTANEOUS | 2 refills | Status: DC

## 2019-11-25 NOTE — Patient Instructions (Signed)
Oatmeal soap and lotion  Zyrtec, Claritan or Allegra during the day  Benadryl 25 mg at night

## 2019-11-25 NOTE — Progress Notes (Signed)
Chart Note The patient called my nurse few days ago complaining of rash in the lower extremities started after her treatment with trametinib.  She has been on this treatment for several weeks with no issues.  My nurse has been in contact with me as well as the patient and we gave her recommendation about ways to handle the rash.  When she had no improvement in her rash, she was offered to come to the symptom management clinic today for further evaluation and recommendation regarding her condition. She was seen by Sandi Mealy the physician assistant at the symptom management clinic today and I discussed with him recommendation for treatment of her condition including local hydrocortisone creams as well as Medrol Dosepak if no improvement in the rash.  Her rash is likely secondary to some of her medications.  The decision was made not to discontinue the medication and continue the treatment for her cancer and try to handle the rash symptomatically. Surprisingly the patient handed the physician assistant a pre- written letter to me requesting transfer of her care to [facility] with a phone number to reach that facility which already has been planned before coming to the visit indicating that her rash was not handled appropriately. I reviewed her communications with nurse/ team and response to her concerns has been done in a very appropriate way and timely manner. I respected the patient's wishes and made a referral for her to see an oncologist at [facility] based on her request. We will discontinue her follow-up visits as well as lab appointment at the [facility].  She will be discharged from the practice at this point. We wish her the best with her future care.

## 2019-11-27 NOTE — Progress Notes (Signed)
[facility] Progress Note   Jillian Hartman [id number] January 21, 1951 69 y.o.  Jillian Hartman is managed by Dr. Fanny Bien. Jillian Hartman  Actively treated with chemotherapy/immunotherapy/hormonal therapy: yes  Current therapy: Treatment with targeted therapy with dabrafenib 150 mg p.o. twice daily in addition to trametinib 2 mg p.o. daily.   Next scheduled appointment with provider: 12/15/2019.  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 Julien Nordmann and is currently treated with dabrafenib and trametinib.  She is scheduled to be seen in follow-up on 12/15/2019.   Rash: Patient was given prescription for triamcinolone lotion.  Please see After Visit Summary for patient specific instructions.  Future Appointments  Date Time Provider [place]  12/15/2019  3:00 PM [facility] 6 [facility] None  12/15/2019  3:30 PM Curt Bears, MD [facility] None    Orders Placed This Encounter  Procedures  . Ambulatory referral to Oncology       Subjective:   Patient ID:  Jillian Hartman is a 69 y.o. (DOB 13-Jun-1950) female.  Chief Complaint: No chief complaint on file.   HPI Jillian Hartman  is a 69 y.o. female with a diagnosis of a secondary squamous cell carcinoma of the head and neck of unknown primary.  She continues to be followed by Dr. Julien Nordmann and is currently treated with dabrafenib and trametinib.  She presents to the clinic today with a rash.  The rash developed on her bilateral lower extremities, mid and lower back on last Friday.  She has been using over-the-counter agents without relief of her symptoms.  She denies any changes in activity or changes in personal care products.   She denies shortness of breath, chest tightness, or difficulty swallowing.  Medications: I have reviewed the patient's current medications.  Allergies: No Known Allergies  Past Medical History:  Diagnosis Date  . Borderline hyperlipidemia   . Hypertension   . NSCL Ca Dx'd 08/2019  . Tonsillitis 12/29/2011  . Varicose vein     Past Surgical History:  Procedure Laterality Date  . ABDOMINAL HYSTERECTOMY    . CERVICAL CONIZATION W/BX     '77 after abnormal pap  . LAPAROSCOPY     '82 fertility work up    Family History  Problem Relation Age of Onset  . Hypertension Mother   . Hypertension Father   . Ulcers Father   . Cancer Father        oral cancer    Social History   Socioeconomic History  . Marital status: Divorced    Spouse name: Not on file  . Number of children: 2  . Years of education: 47  . Highest education level: Not on file  Occupational History  . 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 Smoking status: Never Smoker  . Smokeless tobacco: Never Used  Substance and Sexual Activity  . Alcohol use: Yes    Comment: occasional/rare  . Drug use: Never  . Sexual activity: Not on file  Other Topics Concern  . 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 Married 1968- divorced 1992   1 son 65- 1 daughter died in MVA 9th grade. Monogamous relationship since 1998. 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            Social Determinants of Health   Financial Resource Strain:   . Difficulty of Paying Living Expenses:   Food Insecurity:   . Worried About Charity fundraiser in the Last Year:   . Arboriculturist in the Last Year:   Transportation Needs:   . Film/video editor (Medical):   Marland Kitchen Lack of Transportation (Non-Medical):   Physical Activity:   . Days of Exercise per Week:   . Minutes of Exercise per Session:   Stress:   . Feeling of Stress :   Social Connections:   . Frequency of Communication with Friends  and Family:   . Frequency of Social Gatherings with Friends and Family:   . Attends Religious Services:   . Active Member of Clubs or Organizations:   . Attends Archivist Meetings:   Marland Kitchen Marital Status:   Intimate Partner Violence:   . Fear of Current or Ex-Partner:   . Emotionally Abused:   Marland Kitchen Physically Abused:   . Sexually Abused:     Past Medical History, Surgical history, Social history, and Family history were reviewed and updated as appropriate.   Please see review of systems for further details on the patient's review from today.   Review of Systems:  Review of Systems  Constitutional: Negative for chills, diaphoresis and fever.  HENT: Negative for facial swelling and trouble swallowing.   Respiratory: Negative for cough, chest tightness and shortness of breath.   Cardiovascular: Negative for chest pain.  Skin: Positive for rash.    Objective:   Physical Exam:  BP 116/64 (BP Location: Left Arm, Patient Position: Sitting)   Pulse 98   Temp (!) 97.5 F (36.4 C) (Temporal)   Resp 19   Ht 5\' 4"  (1.626 m)   Wt 116 lb 1.6 oz (52.7 kg)   SpO2 100%   BMI 19.93 kg/m  ECOG: 0  Physical Exam Constitutional:      General: She is not in acute distress.    Appearance: She is not diaphoretic.  HENT:     Head: Normocephalic and atraumatic.  Eyes:     General: No scleral icterus.       Right eye: No discharge.        Left eye: No discharge.  Cardiovascular:     Rate and Rhythm: Normal rate and regular rhythm.     Heart sounds: Normal heart sounds. No murmur heard.  No friction rub. No gallop.   Pulmonary:     Effort: Pulmonary effort is normal. No respiratory distress.     Breath sounds: Normal breath sounds. No wheezing or rales.  Musculoskeletal:        General: No tenderness or deformity.  Skin:    General: Skin is warm and dry.     Findings: Erythema and rash present.     Comments: A diffuse macular erythematous rash is noted over the bilateral lower  extremities and lower back.  Neurological:     Mental Status: She is alert.     Coordination: Coordination normal.     Gait: Gait normal.  Psychiatric:        Mood and Affect: Mood normal.        Behavior: Behavior normal.        Thought Content: Thought content normal.        Judgment: Judgment normal.     Lab Review:  Component Value Date/Time   NA 138 11/17/2019 1357   K 3.7 11/17/2019 1357   CL 102 11/17/2019 1357   CO2 28 11/17/2019 1357   GLUCOSE 103 (H) 11/17/2019 1357   GLUCOSE 97 03/27/2006 1226   BUN 33 (H) 11/17/2019 1357   CREATININE 0.81 11/17/2019 1357   CALCIUM 9.2 11/17/2019 1357   PROT 6.6 11/17/2019 1357   ALBUMIN 3.1 (L) 11/17/2019 1357   AST 28 11/17/2019 1357   ALT 21 11/17/2019 1357   ALKPHOS 170 (H) 11/17/2019 1357   BILITOT 0.3 11/17/2019 1357   GFRNONAA >60 11/17/2019 1357   GFRAA >60 11/17/2019 1357       Component Value Date/Time   WBC 10.5 11/17/2019 1357   WBC 15.5 (H) 07/15/2019 1543   RBC 3.97 11/17/2019 1357   HGB 11.8 (L) 11/17/2019 1357   HCT 36.9 11/17/2019 1357   PLT 222 11/17/2019 1357   MCV 92.9 11/17/2019 1357   MCH 29.7 11/17/2019 1357   MCHC 32.0 11/17/2019 1357   RDW 18.4 (H) 11/17/2019 1357   LYMPHSABS 6.0 (H) 11/17/2019 1357   MONOABS 0.5 11/17/2019 1357   EOSABS 0.1 11/17/2019 1357   BASOSABS 0.1 11/17/2019 1357   -------------------------------  Imaging from last 24 hours (if applicable):  Radiology interpretation: CT Chest W Contrast  Result Date: 11/17/2019 CLINICAL DATA:  Non-small-cell lung cancer diagnosed in March. Hysterectomy. Oral chemotherapy ongoing. 15 pound weight loss over 1 year. Cervical node biopsy 08/19/2019 demonstrating squamous cell carcinoma. EXAM: CT CHEST, ABDOMEN, AND PELVIS WITH CONTRAST TECHNIQUE: Multidetector CT imaging of the chest, abdomen and pelvis was performed following the standard protocol during bolus administration of intravenous contrast. CONTRAST:  173mL OMNIPAQUE  IOHEXOL 300 MG/ML  SOLN COMPARISON:  09/07/2019 PET.  Chest CT 07/17/2019. FINDINGS: CT CHEST FINDINGS Cardiovascular: Aortic atherosclerosis. Normal heart size, without pericardial effusion. Multivessel coronary artery atherosclerosis. No central pulmonary embolism, on this non-dedicated study. Mediastinum/Nodes: Thyroid enlargement and heterogeneity is chronic and nonspecific. A necrotic low jugular/supraclavicular node measures 2.1 cm on 08/02 versus 1.8 cm on the prior PET (when remeasured). Index right axillary node measures 1.2 cm on 24/2 versus 1.0 cm on the prior exam (when remeasured). Subcarinal nodal mass measures 1.9 x 3.3 cm on 34/2 versus 2.1 x 4.0 cm on the prior PET Lungs/Pleura: No pleural fluid. Macrolobulated right middle lobe pulmonary nodule measures 1.4 x 1.0 cm on 99/6 versus 1.5 x 0.9 cm on the 09/07/2019 PET (when remeasured). A superior segment right lower lobe 4 mm nodule on 67/6 has enlarged from 2 mm on the prior PET. Significant improvement to resolution of innumerable smaller pulmonary nodules on the 09/07/2019 PET. Musculoskeletal: New sclerotic lesions at the site of lytic metastasis on the prior PET. Example within the sternal manubrium at 7 mm on 22/2. T11 moderate compression deformity is new since the prior diagnostic chest CT, pathologic. CT ABDOMEN PELVIS FINDINGS Hepatobiliary: Hypoattenuating segment 2 1.2 cm lesion on 56/2 is decreased (given comparison and noncontrast CT from PET) compared to 2.0 cm on 09/07/2019 (when remeasured). Anterior segment 3 lesion measures 1.0 cm today versus 2.0 cm on the prior PET. Normal gallbladder, without biliary ductal dilatation. Pancreas: Cystic lesion within the pancreatic head versus an area of side branch duct ectasia, 7 mm on 68/2. Of doubtful clinical significance, given comorbidities. Spleen: Normal in size, without focal abnormality. Adrenals/Urinary Tract: Normal adrenal glands. Probable splenule positioned between the left  adrenal gland and spleen including on 57/2. Upper pole right renal 3.2 cm  cyst. Normal left kidney and urinary bladder. Stomach/Bowel: Normal stomach, without wall thickening. Normal colon and terminal ileum. Normal small bowel. Vascular/Lymphatic: Aortic atherosclerosis. Abdominal retroperitoneal adenopathy. Left periaortic node measures 1.2 cm on 67/2 versus 1.5 cm on the prior PET (when remeasured). Marked pelvic sidewall adenopathy, with a right external iliac index node measuring 2.6 cm on 105/2 versus 2.3 cm on the prior. A left external iliac node measures 2.2 cm on 104/2 versus 1.9 cm on the prior PET (when remeasured). Reproductive: Hysterectomy.  No adnexal mass. Other: No significant free fluid.  Mild pelvic floor laxity. Musculoskeletal: New sclerosis in the left ischial tuberosity, at the site of a lytic lesion on the prior PET. Example 116/2, measuring 1.1 cm. Similar findings within the left pubic bone, including on 114/2. New L5 lytic lesion on 91/5, without vertebral body height loss. IMPRESSION: 1. Primarily response to therapy since the PET of 09/07/2019, including decreased lung lesions, thoracoabdominal nodes, and liver lesions. 2. Progressive adenopathy within the pelvis, suggesting mixed response to therapy. 3. Overall, the appearance of the nodes is more typical of lymphoma than metastatic squamous cell carcinoma. However, a neck node was biopsied and positive for squamous cell carcinoma 08/19/2019. Therefore, this is presumably an atypical appearance of widespread metastatic squamous cell carcinoma. 4. Primarily healing of osseous metastasis, as evidenced by new areas of sclerosis. A new T11 compression deformity is pathologic, without ventral canal encroachment. 5. Coronary artery atherosclerosis. Aortic Atherosclerosis (ICD10-I70.0). Electronically Signed   By: Abigail Miyamoto M.D.   On: 11/17/2019 19:59   MR BRAIN W WO CONTRAST  Result Date: 11/18/2019 CLINICAL DATA:  Non-small-cell lung  cancer staging. EXAM: MRI HEAD WITHOUT AND WITH CONTRAST TECHNIQUE: Multiplanar, multiecho pulse sequences of the brain and surrounding structures were obtained without and with intravenous contrast. CONTRAST:  21mL GADAVIST GADOBUTROL 1 MMOL/ML IV SOLN COMPARISON:  None. FINDINGS: Brain: Ventricle size and cerebral volume normal. Negative for acute infarct. Scattered small nonenhancing white matter hyperintensities bilaterally most consistent with chronic microvascular ischemia. Negative for hemorrhage or mass Postcontrast imaging demonstrates no enhancing metastatic deposits. Vascular: Normal arterial flow voids Skull and upper cervical spine: No focal skeletal lesion identified. Sinuses/Orbits: Extensive mucosal edema and fluid in the right maxillary sinus. Mild mucosal edema right ethmoid sinus. Remaining sinuses clear. Negative orbit Other: None IMPRESSION: Negative for metastatic disease to the brain Mild chronic microvascular ischemic change in the white matter. Electronically Signed   By: Franchot Gallo M.D.   On: 11/18/2019 13:38   CT Abdomen Pelvis W Contrast  Result Date: 11/17/2019 CLINICAL DATA:  Non-small-cell lung cancer diagnosed in March. Hysterectomy. Oral chemotherapy ongoing. 15 pound weight loss over 1 year. Cervical node biopsy 08/19/2019 demonstrating squamous cell carcinoma. EXAM: CT CHEST, ABDOMEN, AND PELVIS WITH CONTRAST TECHNIQUE: Multidetector CT imaging of the chest, abdomen and pelvis was performed following the standard protocol during bolus administration of intravenous contrast. CONTRAST:  176mL OMNIPAQUE IOHEXOL 300 MG/ML  SOLN COMPARISON:  09/07/2019 PET.  Chest CT 07/17/2019. FINDINGS: CT CHEST FINDINGS Cardiovascular: Aortic atherosclerosis. Normal heart size, without pericardial effusion. Multivessel coronary artery atherosclerosis. No central pulmonary embolism, on this non-dedicated study. Mediastinum/Nodes: Thyroid enlargement and heterogeneity is chronic and nonspecific.  A necrotic low jugular/supraclavicular node measures 2.1 cm on 08/02 versus 1.8 cm on the prior PET (when remeasured). Index right axillary node measures 1.2 cm on 24/2 versus 1.0 cm on the prior exam (when remeasured). Subcarinal nodal mass measures 1.9 x 3.3 cm on 34/2 versus 2.1 x 4.0  cm on the prior PET Lungs/Pleura: No pleural fluid. Macrolobulated right middle lobe pulmonary nodule measures 1.4 x 1.0 cm on 99/6 versus 1.5 x 0.9 cm on the 09/07/2019 PET (when remeasured). A superior segment right lower lobe 4 mm nodule on 67/6 has enlarged from 2 mm on the prior PET. Significant improvement to resolution of innumerable smaller pulmonary nodules on the 09/07/2019 PET. Musculoskeletal: New sclerotic lesions at the site of lytic metastasis on the prior PET. Example within the sternal manubrium at 7 mm on 22/2. T11 moderate compression deformity is new since the prior diagnostic chest CT, pathologic. CT ABDOMEN PELVIS FINDINGS Hepatobiliary: Hypoattenuating segment 2 1.2 cm lesion on 56/2 is decreased (given comparison and noncontrast CT from PET) compared to 2.0 cm on 09/07/2019 (when remeasured). Anterior segment 3 lesion measures 1.0 cm today versus 2.0 cm on the prior PET. Normal gallbladder, without biliary ductal dilatation. Pancreas: Cystic lesion within the pancreatic head versus an area of side branch duct ectasia, 7 mm on 68/2. Of doubtful clinical significance, given comorbidities. Spleen: Normal in size, without focal abnormality. Adrenals/Urinary Tract: Normal adrenal glands. Probable splenule positioned between the left adrenal gland and spleen including on 57/2. Upper pole right renal 3.2 cm cyst. Normal left kidney and urinary bladder. Stomach/Bowel: Normal stomach, without wall thickening. Normal colon and terminal ileum. Normal small bowel. Vascular/Lymphatic: Aortic atherosclerosis. Abdominal retroperitoneal adenopathy. Left periaortic node measures 1.2 cm on 67/2 versus 1.5 cm on the prior PET  (when remeasured). Marked pelvic sidewall adenopathy, with a right external iliac index node measuring 2.6 cm on 105/2 versus 2.3 cm on the prior. A left external iliac node measures 2.2 cm on 104/2 versus 1.9 cm on the prior PET (when remeasured). Reproductive: Hysterectomy.  No adnexal mass. Other: No significant free fluid.  Mild pelvic floor laxity. Musculoskeletal: New sclerosis in the left ischial tuberosity, at the site of a lytic lesion on the prior PET. Example 116/2, measuring 1.1 cm. Similar findings within the left pubic bone, including on 114/2. New L5 lytic lesion on 91/5, without vertebral body height loss. IMPRESSION: 1. Primarily response to therapy since the PET of 09/07/2019, including decreased lung lesions, thoracoabdominal nodes, and liver lesions. 2. Progressive adenopathy within the pelvis, suggesting mixed response to therapy. 3. Overall, the appearance of the nodes is more typical of lymphoma than metastatic squamous cell carcinoma. However, a neck node was biopsied and positive for squamous cell carcinoma 08/19/2019. Therefore, this is presumably an atypical appearance of widespread metastatic squamous cell carcinoma. 4. Primarily healing of osseous metastasis, as evidenced by new areas of sclerosis. A new T11 compression deformity is pathologic, without ventral canal encroachment. 5. Coronary artery atherosclerosis. Aortic Atherosclerosis (ICD10-I70.0). Electronically Signed   By: Abigail Miyamoto M.D.   On: 11/17/2019 19:59        This case was discussed with Dr. Julien Nordmann. He expressed agreement with my management of this patient.

## 2019-11-30 ENCOUNTER — Telehealth: Payer: Self-pay | Admitting: Medical Oncology

## 2019-11-30 NOTE — Telephone Encounter (Signed)
called pt and acknowledged receipt of her letter and told her Dr Julien Nordmann made a referral to [facility].   Records faxed to [place].

## 2019-12-09 DIAGNOSIS — C9110 Chronic lymphocytic leukemia of B-cell type not having achieved remission: Secondary | ICD-10-CM | POA: Diagnosis not present

## 2019-12-09 DIAGNOSIS — R590 Localized enlarged lymph nodes: Secondary | ICD-10-CM | POA: Diagnosis not present

## 2019-12-09 DIAGNOSIS — I10 Essential (primary) hypertension: Secondary | ICD-10-CM | POA: Diagnosis not present

## 2019-12-09 DIAGNOSIS — Z85850 Personal history of malignant neoplasm of thyroid: Secondary | ICD-10-CM | POA: Diagnosis not present

## 2019-12-09 DIAGNOSIS — E079 Disorder of thyroid, unspecified: Secondary | ICD-10-CM | POA: Diagnosis not present

## 2019-12-09 DIAGNOSIS — Z79899 Other long term (current) drug therapy: Secondary | ICD-10-CM | POA: Diagnosis not present

## 2019-12-09 DIAGNOSIS — R0989 Other specified symptoms and signs involving the circulatory and respiratory systems: Secondary | ICD-10-CM | POA: Diagnosis not present

## 2019-12-09 DIAGNOSIS — Z8505 Personal history of malignant neoplasm of liver: Secondary | ICD-10-CM | POA: Diagnosis not present

## 2019-12-09 DIAGNOSIS — Z85118 Personal history of other malignant neoplasm of bronchus and lung: Secondary | ICD-10-CM | POA: Diagnosis not present

## 2019-12-11 ENCOUNTER — Telehealth: Payer: Self-pay | Admitting: Medical

## 2019-12-11 DIAGNOSIS — Z596 Low income: Secondary | ICD-10-CM | POA: Diagnosis not present

## 2019-12-11 DIAGNOSIS — C787 Secondary malignant neoplasm of liver and intrahepatic bile duct: Secondary | ICD-10-CM | POA: Diagnosis not present

## 2019-12-11 DIAGNOSIS — R590 Localized enlarged lymph nodes: Secondary | ICD-10-CM | POA: Diagnosis not present

## 2019-12-11 DIAGNOSIS — C770 Secondary and unspecified malignant neoplasm of lymph nodes of head, face and neck: Secondary | ICD-10-CM | POA: Diagnosis not present

## 2019-12-11 DIAGNOSIS — I10 Essential (primary) hypertension: Secondary | ICD-10-CM | POA: Diagnosis not present

## 2019-12-11 DIAGNOSIS — J90 Pleural effusion, not elsewhere classified: Secondary | ICD-10-CM | POA: Diagnosis not present

## 2019-12-11 DIAGNOSIS — Z79899 Other long term (current) drug therapy: Secondary | ICD-10-CM | POA: Diagnosis not present

## 2019-12-11 DIAGNOSIS — C7951 Secondary malignant neoplasm of bone: Secondary | ICD-10-CM | POA: Diagnosis not present

## 2019-12-11 DIAGNOSIS — C9110 Chronic lymphocytic leukemia of B-cell type not having achieved remission: Secondary | ICD-10-CM | POA: Diagnosis not present

## 2019-12-11 DIAGNOSIS — C3490 Malignant neoplasm of unspecified part of unspecified bronchus or lung: Secondary | ICD-10-CM | POA: Diagnosis not present

## 2019-12-11 DIAGNOSIS — C760 Malignant neoplasm of head, face and neck: Secondary | ICD-10-CM | POA: Diagnosis not present

## 2019-12-11 DIAGNOSIS — Z85118 Personal history of other malignant neoplasm of bronchus and lung: Secondary | ICD-10-CM | POA: Diagnosis not present

## 2019-12-11 NOTE — Telephone Encounter (Signed)
Scheduled per 06/23 los, cancelled appointments per patient's request regarding los.

## 2019-12-15 ENCOUNTER — Inpatient Hospital Stay: Payer: BC Managed Care – PPO

## 2019-12-15 ENCOUNTER — Inpatient Hospital Stay: Payer: BC Managed Care – PPO | Admitting: Internal Medicine

## 2019-12-16 DIAGNOSIS — D649 Anemia, unspecified: Secondary | ICD-10-CM | POA: Diagnosis not present

## 2019-12-16 DIAGNOSIS — C9110 Chronic lymphocytic leukemia of B-cell type not having achieved remission: Secondary | ICD-10-CM | POA: Diagnosis not present

## 2019-12-16 DIAGNOSIS — R591 Generalized enlarged lymph nodes: Secondary | ICD-10-CM | POA: Diagnosis not present

## 2019-12-16 DIAGNOSIS — C8300 Small cell B-cell lymphoma, unspecified site: Secondary | ICD-10-CM | POA: Diagnosis not present

## 2019-12-16 DIAGNOSIS — I10 Essential (primary) hypertension: Secondary | ICD-10-CM | POA: Diagnosis not present

## 2019-12-21 DIAGNOSIS — Z79899 Other long term (current) drug therapy: Secondary | ICD-10-CM | POA: Diagnosis not present

## 2019-12-21 DIAGNOSIS — C9110 Chronic lymphocytic leukemia of B-cell type not having achieved remission: Secondary | ICD-10-CM | POA: Diagnosis not present

## 2019-12-21 DIAGNOSIS — C760 Malignant neoplasm of head, face and neck: Secondary | ICD-10-CM | POA: Diagnosis not present

## 2019-12-21 DIAGNOSIS — C7951 Secondary malignant neoplasm of bone: Secondary | ICD-10-CM | POA: Diagnosis not present

## 2019-12-22 DIAGNOSIS — C8300 Small cell B-cell lymphoma, unspecified site: Secondary | ICD-10-CM | POA: Diagnosis not present

## 2019-12-23 DIAGNOSIS — C787 Secondary malignant neoplasm of liver and intrahepatic bile duct: Secondary | ICD-10-CM | POA: Diagnosis not present

## 2019-12-23 DIAGNOSIS — D649 Anemia, unspecified: Secondary | ICD-10-CM | POA: Diagnosis not present

## 2019-12-23 DIAGNOSIS — R634 Abnormal weight loss: Secondary | ICD-10-CM | POA: Diagnosis not present

## 2019-12-23 DIAGNOSIS — C4492 Squamous cell carcinoma of skin, unspecified: Secondary | ICD-10-CM | POA: Diagnosis not present

## 2019-12-23 DIAGNOSIS — D72820 Lymphocytosis (symptomatic): Secondary | ICD-10-CM | POA: Diagnosis not present

## 2019-12-23 DIAGNOSIS — J90 Pleural effusion, not elsewhere classified: Secondary | ICD-10-CM | POA: Diagnosis not present

## 2019-12-23 DIAGNOSIS — R05 Cough: Secondary | ICD-10-CM | POA: Diagnosis not present

## 2019-12-23 DIAGNOSIS — C7951 Secondary malignant neoplasm of bone: Secondary | ICD-10-CM | POA: Diagnosis not present

## 2019-12-23 DIAGNOSIS — C9110 Chronic lymphocytic leukemia of B-cell type not having achieved remission: Secondary | ICD-10-CM | POA: Diagnosis not present

## 2019-12-23 DIAGNOSIS — E079 Disorder of thyroid, unspecified: Secondary | ICD-10-CM | POA: Diagnosis not present

## 2019-12-23 MED FILL — MEKINIST 2 MG TAB: 2 | 30 days supply | Qty: 30 | Fill #0

## 2019-12-23 MED FILL — TAFINLAR 75 MG CAPSULE: 75 | 30 days supply | Qty: 120 | Fill #0

## 2019-12-24 DIAGNOSIS — C3490 Malignant neoplasm of unspecified part of unspecified bronchus or lung: Secondary | ICD-10-CM | POA: Diagnosis not present

## 2019-12-24 DIAGNOSIS — C4492 Squamous cell carcinoma of skin, unspecified: Secondary | ICD-10-CM | POA: Diagnosis not present

## 2019-12-24 DIAGNOSIS — I10 Essential (primary) hypertension: Secondary | ICD-10-CM | POA: Diagnosis not present

## 2019-12-24 DIAGNOSIS — I499 Cardiac arrhythmia, unspecified: Secondary | ICD-10-CM | POA: Diagnosis not present

## 2019-12-24 DIAGNOSIS — C9110 Chronic lymphocytic leukemia of B-cell type not having achieved remission: Secondary | ICD-10-CM | POA: Diagnosis not present

## 2019-12-24 DIAGNOSIS — Z01818 Encounter for other preprocedural examination: Secondary | ICD-10-CM | POA: Diagnosis not present

## 2019-12-24 DIAGNOSIS — E079 Disorder of thyroid, unspecified: Secondary | ICD-10-CM | POA: Diagnosis not present

## 2019-12-24 DIAGNOSIS — C760 Malignant neoplasm of head, face and neck: Secondary | ICD-10-CM | POA: Diagnosis not present

## 2019-12-30 DIAGNOSIS — C760 Malignant neoplasm of head, face and neck: Secondary | ICD-10-CM | POA: Diagnosis not present

## 2020-01-07 DIAGNOSIS — C801 Malignant (primary) neoplasm, unspecified: Secondary | ICD-10-CM | POA: Diagnosis not present

## 2020-01-07 DIAGNOSIS — Z79899 Other long term (current) drug therapy: Secondary | ICD-10-CM | POA: Diagnosis not present

## 2020-01-07 DIAGNOSIS — C4492 Squamous cell carcinoma of skin, unspecified: Secondary | ICD-10-CM | POA: Diagnosis not present

## 2020-01-07 DIAGNOSIS — C9110 Chronic lymphocytic leukemia of B-cell type not having achieved remission: Secondary | ICD-10-CM | POA: Diagnosis not present

## 2020-01-07 DIAGNOSIS — I10 Essential (primary) hypertension: Secondary | ICD-10-CM | POA: Diagnosis not present

## 2020-01-07 DIAGNOSIS — C7951 Secondary malignant neoplasm of bone: Secondary | ICD-10-CM | POA: Diagnosis not present

## 2020-01-07 DIAGNOSIS — Z8505 Personal history of malignant neoplasm of liver: Secondary | ICD-10-CM | POA: Diagnosis not present

## 2020-01-07 DIAGNOSIS — Z85850 Personal history of malignant neoplasm of thyroid: Secondary | ICD-10-CM | POA: Diagnosis not present

## 2020-01-07 DIAGNOSIS — Z85118 Personal history of other malignant neoplasm of bronchus and lung: Secondary | ICD-10-CM | POA: Diagnosis not present

## 2020-01-18 DIAGNOSIS — G8918 Other acute postprocedural pain: Secondary | ICD-10-CM | POA: Diagnosis not present

## 2020-01-18 DIAGNOSIS — C9110 Chronic lymphocytic leukemia of B-cell type not having achieved remission: Secondary | ICD-10-CM | POA: Diagnosis not present

## 2020-01-18 DIAGNOSIS — D649 Anemia, unspecified: Secondary | ICD-10-CM | POA: Diagnosis not present

## 2020-01-18 DIAGNOSIS — C4492 Squamous cell carcinoma of skin, unspecified: Secondary | ICD-10-CM | POA: Diagnosis not present

## 2020-01-18 DIAGNOSIS — R590 Localized enlarged lymph nodes: Secondary | ICD-10-CM | POA: Diagnosis not present

## 2020-01-18 DIAGNOSIS — Z79899 Other long term (current) drug therapy: Secondary | ICD-10-CM | POA: Diagnosis not present

## 2020-01-25 MED FILL — MEKINIST 2 MG TAB: 2 | 30 days supply | Qty: 30 | Fill #0

## 2020-01-25 MED FILL — TAFINLAR 75 MG CAPSULE: 75 | 30 days supply | Qty: 120 | Fill #0

## 2020-01-28 MED FILL — predniSONE 20 MG TABS: 20 | 53 days supply | Qty: 60 | Fill #0

## 2020-02-02 DIAGNOSIS — Z79899 Other long term (current) drug therapy: Secondary | ICD-10-CM | POA: Diagnosis not present

## 2020-02-02 DIAGNOSIS — C3490 Malignant neoplasm of unspecified part of unspecified bronchus or lung: Secondary | ICD-10-CM | POA: Diagnosis not present

## 2020-02-02 DIAGNOSIS — C4492 Squamous cell carcinoma of skin, unspecified: Secondary | ICD-10-CM | POA: Diagnosis not present

## 2020-02-02 DIAGNOSIS — C9110 Chronic lymphocytic leukemia of B-cell type not having achieved remission: Secondary | ICD-10-CM | POA: Diagnosis not present

## 2020-02-02 DIAGNOSIS — R590 Localized enlarged lymph nodes: Secondary | ICD-10-CM | POA: Diagnosis not present

## 2020-02-03 DIAGNOSIS — C8590 Non-Hodgkin lymphoma, unspecified, unspecified site: Secondary | ICD-10-CM | POA: Diagnosis not present

## 2020-02-03 DIAGNOSIS — R911 Solitary pulmonary nodule: Secondary | ICD-10-CM | POA: Diagnosis not present

## 2020-02-03 DIAGNOSIS — C3490 Malignant neoplasm of unspecified part of unspecified bronchus or lung: Secondary | ICD-10-CM | POA: Diagnosis not present

## 2020-02-03 DIAGNOSIS — C9110 Chronic lymphocytic leukemia of B-cell type not having achieved remission: Secondary | ICD-10-CM | POA: Diagnosis not present

## 2020-02-03 DIAGNOSIS — C7951 Secondary malignant neoplasm of bone: Secondary | ICD-10-CM | POA: Diagnosis not present

## 2020-02-03 DIAGNOSIS — C4492 Squamous cell carcinoma of skin, unspecified: Secondary | ICD-10-CM | POA: Diagnosis not present

## 2020-02-03 DIAGNOSIS — R590 Localized enlarged lymph nodes: Secondary | ICD-10-CM | POA: Diagnosis not present

## 2020-02-16 DIAGNOSIS — C3411 Malignant neoplasm of upper lobe, right bronchus or lung: Secondary | ICD-10-CM | POA: Diagnosis not present

## 2020-02-16 DIAGNOSIS — C3490 Malignant neoplasm of unspecified part of unspecified bronchus or lung: Secondary | ICD-10-CM | POA: Diagnosis not present

## 2020-02-16 DIAGNOSIS — R590 Localized enlarged lymph nodes: Secondary | ICD-10-CM | POA: Diagnosis not present

## 2020-02-16 DIAGNOSIS — C9110 Chronic lymphocytic leukemia of B-cell type not having achieved remission: Secondary | ICD-10-CM | POA: Diagnosis not present

## 2020-02-16 DIAGNOSIS — C4492 Squamous cell carcinoma of skin, unspecified: Secondary | ICD-10-CM | POA: Diagnosis not present

## 2020-02-26 DIAGNOSIS — C4492 Squamous cell carcinoma of skin, unspecified: Secondary | ICD-10-CM | POA: Diagnosis not present

## 2020-02-26 DIAGNOSIS — C3490 Malignant neoplasm of unspecified part of unspecified bronchus or lung: Secondary | ICD-10-CM | POA: Diagnosis not present

## 2020-02-26 DIAGNOSIS — Z5111 Encounter for antineoplastic chemotherapy: Secondary | ICD-10-CM | POA: Diagnosis not present

## 2020-02-26 DIAGNOSIS — Z5112 Encounter for antineoplastic immunotherapy: Secondary | ICD-10-CM | POA: Diagnosis not present

## 2020-02-26 DIAGNOSIS — R590 Localized enlarged lymph nodes: Secondary | ICD-10-CM | POA: Diagnosis not present

## 2020-02-26 DIAGNOSIS — C9110 Chronic lymphocytic leukemia of B-cell type not having achieved remission: Secondary | ICD-10-CM | POA: Diagnosis not present

## 2020-03-11 ENCOUNTER — Encounter (HOSPITAL_COMMUNITY): Payer: Self-pay

## 2020-03-11 ENCOUNTER — Other Ambulatory Visit: Payer: Self-pay

## 2020-03-11 ENCOUNTER — Inpatient Hospital Stay (HOSPITAL_COMMUNITY)
Admission: EM | Admit: 2020-03-11 | Discharge: 2020-03-16 | DRG: 844 | Disposition: A | Discharge status: Home Health | Payer: BC Managed Care – PPO | Attending: Internal Medicine | Admitting: Internal Medicine

## 2020-03-11 DIAGNOSIS — Z79899 Other long term (current) drug therapy: Secondary | ICD-10-CM

## 2020-03-11 DIAGNOSIS — D630 Anemia in neoplastic disease: Secondary | ICD-10-CM | POA: Diagnosis not present

## 2020-03-11 DIAGNOSIS — I82C12 Acute embolism and thrombosis of left internal jugular vein: Secondary | ICD-10-CM | POA: Diagnosis not present

## 2020-03-11 DIAGNOSIS — C801 Malignant (primary) neoplasm, unspecified: Secondary | ICD-10-CM

## 2020-03-11 DIAGNOSIS — I82612 Acute embolism and thrombosis of superficial veins of left upper extremity: Secondary | ICD-10-CM | POA: Diagnosis present

## 2020-03-11 DIAGNOSIS — Z515 Encounter for palliative care: Secondary | ICD-10-CM | POA: Diagnosis not present

## 2020-03-11 DIAGNOSIS — Z20822 Contact with and (suspected) exposure to covid-19: Secondary | ICD-10-CM | POA: Diagnosis not present

## 2020-03-11 DIAGNOSIS — C3490 Malignant neoplasm of unspecified part of unspecified bronchus or lung: Secondary | ICD-10-CM | POA: Diagnosis present

## 2020-03-11 DIAGNOSIS — Z90710 Acquired absence of both cervix and uterus: Secondary | ICD-10-CM | POA: Diagnosis not present

## 2020-03-11 DIAGNOSIS — D72829 Elevated white blood cell count, unspecified: Secondary | ICD-10-CM | POA: Diagnosis not present

## 2020-03-11 DIAGNOSIS — I82290 Acute embolism and thrombosis of other thoracic veins: Secondary | ICD-10-CM | POA: Diagnosis not present

## 2020-03-11 DIAGNOSIS — E785 Hyperlipidemia, unspecified: Secondary | ICD-10-CM | POA: Diagnosis not present

## 2020-03-11 DIAGNOSIS — R54 Age-related physical debility: Secondary | ICD-10-CM | POA: Diagnosis present

## 2020-03-11 DIAGNOSIS — R531 Weakness: Secondary | ICD-10-CM | POA: Diagnosis not present

## 2020-03-11 DIAGNOSIS — T451X5A Adverse effect of antineoplastic and immunosuppressive drugs, initial encounter: Secondary | ICD-10-CM | POA: Diagnosis present

## 2020-03-11 DIAGNOSIS — C7989 Secondary malignant neoplasm of other specified sites: Secondary | ICD-10-CM | POA: Diagnosis present

## 2020-03-11 DIAGNOSIS — Z7952 Long term (current) use of systemic steroids: Secondary | ICD-10-CM

## 2020-03-11 DIAGNOSIS — Z7189 Other specified counseling: Secondary | ICD-10-CM | POA: Diagnosis not present

## 2020-03-11 DIAGNOSIS — E860 Dehydration: Secondary | ICD-10-CM | POA: Diagnosis not present

## 2020-03-11 DIAGNOSIS — I82622 Acute embolism and thrombosis of deep veins of left upper extremity: Secondary | ICD-10-CM | POA: Diagnosis not present

## 2020-03-11 DIAGNOSIS — M7989 Other specified soft tissue disorders: Secondary | ICD-10-CM | POA: Diagnosis not present

## 2020-03-11 DIAGNOSIS — C8301 Small cell B-cell lymphoma, lymph nodes of head, face, and neck: Secondary | ICD-10-CM | POA: Diagnosis not present

## 2020-03-11 DIAGNOSIS — D649 Anemia, unspecified: Secondary | ICD-10-CM | POA: Diagnosis not present

## 2020-03-11 DIAGNOSIS — C9110 Chronic lymphocytic leukemia of B-cell type not having achieved remission: Secondary | ICD-10-CM | POA: Diagnosis present

## 2020-03-11 DIAGNOSIS — E876 Hypokalemia: Secondary | ICD-10-CM | POA: Diagnosis present

## 2020-03-11 DIAGNOSIS — R627 Adult failure to thrive: Secondary | ICD-10-CM | POA: Diagnosis present

## 2020-03-11 DIAGNOSIS — I10 Essential (primary) hypertension: Secondary | ICD-10-CM | POA: Diagnosis not present

## 2020-03-11 LAB — CBC
HCT: 26.7 % — ABNORMAL LOW (ref 36.0–46.0)
Hemoglobin: 8.2 g/dL — ABNORMAL LOW (ref 12.0–15.0)
MCH: 29.1 pg (ref 26.0–34.0)
MCHC: 30.7 g/dL (ref 30.0–36.0)
MCV: 94.7 fL (ref 80.0–100.0)
Platelets: 291 10*3/uL (ref 150–400)
RBC: 2.82 MIL/uL — ABNORMAL LOW (ref 3.87–5.11)
RDW: 15.9 % — ABNORMAL HIGH (ref 11.5–15.5)
WBC: UNDETERMINED 10*3/uL (ref 4.0–10.5)
WBC: 54.6 10*3/uL (ref 4.0–10.5)
nRBC: UNDETERMINED % (ref 0.0–0.2)
nRBC: 0 % (ref 0.0–0.2)

## 2020-03-11 LAB — HEPATIC FUNCTION PANEL
ALT: 12 U/L (ref 0–44)
AST: 18 U/L (ref 15–41)
Albumin: 2 g/dL — ABNORMAL LOW (ref 3.5–5.0)
Alkaline Phosphatase: 110 U/L (ref 38–126)
Bilirubin, Direct: 0.1 mg/dL (ref 0.0–0.2)
Indirect Bilirubin: 0.3 mg/dL (ref 0.3–0.9)
Total Bilirubin: 0.4 mg/dL (ref 0.3–1.2)
Total Protein: 5.4 g/dL — ABNORMAL LOW (ref 6.5–8.1)

## 2020-03-11 LAB — BASIC METABOLIC PANEL
Anion gap: 15 (ref 5–15)
BUN: 19 mg/dL (ref 8–23)
CO2: 27 mmol/L (ref 22–32)
Calcium: 8.6 mg/dL — ABNORMAL LOW (ref 8.9–10.3)
Chloride: 95 mmol/L — ABNORMAL LOW (ref 98–111)
Creatinine, Ser: 0.85 mg/dL (ref 0.44–1.00)
GFR, Estimated: >60 mL/min (ref >60)
Glucose, Bld: 163 mg/dL — ABNORMAL HIGH (ref 70–99)
Potassium: 3.6 mmol/L (ref 3.5–5.1)
Sodium: 137 mmol/L (ref 135–145)

## 2020-03-11 MED ORDER — SODIUM CHLORIDE 0.9 % IV BOLUS
1000.0000 mL | Freq: Once | INTRAVENOUS | Status: AC
  Administered 2020-03-11: 1000 mL via INTRAVENOUS

## 2020-03-11 NOTE — ED Notes (Signed)
[name] Keatley (son) called about patient please call back [phone]

## 2020-03-11 NOTE — ED Triage Notes (Signed)
Pt arrives to ED w/ c/o decreased appetite and weakness d/t mass in her throat. Pt has hx of leukemia and seen at [facility]. EMS [facility].

## 2020-03-11 NOTE — ED Provider Notes (Signed)
[facility] Provider Note   CSN: [id number] Arrival date & time: 03/11/20  1806     History Chief Complaint  Patient presents with  . Weakness    [facility] is a 69 y.o. female with history of small lymphocytic lymphoma/chronic lymphocytic leukemia and stage IV small cell carcinoma on chemotherapy and immunotherapy, hypertension, hyperlipidemia who presents with generalized weakness over the past 2 weeks. Per documentation from [facility] hematology, patient is currently getting carboplatin, Taxol, pembrolizumab as well as Xgeva.  She states she started these new chemotherapy and immunotherapy agents 2 weeks ago.  Since then, patient has had increasing fatigue, difficulty ambulating, and poor p.o. intake.  She has several large tumors on her neck, which interfere with her swallowing making it painful to eat.  She spoke with her oncologist today, who instructed her to come to the [facility] for evaluation.  Patient denies fevers, dysuria, chest pain.  She does state that she is incredibly fatigued but is walking across the room, but denies shortness of breath.   Weakness Severity:  Moderate Onset quality:  Sudden Duration:  2 weeks Timing:  Constant Progression:  Worsening Chronicity:  New Context: change in medication   Relieved by:  Nothing Worsened by:  Nothing Associated symptoms: anorexia and difficulty walking   Associated symptoms: no abdominal pain, no arthralgias, no chest pain, no cough, no diarrhea, no dysuria, no falls, no fever, no nausea, no seizures, no shortness of breath and no vomiting        Past Medical History:  Diagnosis Date  . Borderline hyperlipidemia   . Hypertension   . NSCL Ca Dx'd 08/2019  . Tonsillitis 12/29/2011  . Varicose vein     Patient Active Problem List   Diagnosis Date Noted  . General weakness 03/11/2020  . Encounter for antineoplastic chemotherapy 10/27/2019  . Normocytic anemia 09/16/2019  .  Leukocytosis 09/16/2019  . Goals of care, counseling/discussion 09/16/2019  . 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 Neck swelling 07/15/2019  . Infected tooth 07/15/2019  . Cough 07/15/2019  . Weight loss 07/15/2019  . Contusion of multiple sites of right shoulder 04/25/2016  . Well adult exam 02/19/2015  . Hypokalemia 02/19/2015  . Edema 02/16/2015  . Trigeminy 02/16/2015  . Hematuria 10/25/2011  . Routine health maintenance 04/15/2011  . Essential hypertension 05/14/2007  . VARICOSE VEIN 05/14/2007    Past Surgical History:  Procedure Laterality Date  . ABDOMINAL HYSTERECTOMY    . CERVICAL CONIZATION W/BX     '77 after abnormal pap  . LAPAROSCOPY     '82 fertility work up     OB History   No obstetric history on file.     Family History  Problem Relation Age of Onset  . Hypertension Mother   . Hypertension Father   . Ulcers Father   . Cancer Father        oral cancer    Social History   Tobacco Use  . Smoking status: Never Smoker  . Smokeless tobacco: Never Used  Substance Use Topics  . Alcohol use: Yes    Comment: occasional/rare  . Drug use: Never    Home Medications Prior to Admission medications   Medication Sig Start Date End Date Taking? Authorizing Provider  Calcium Carb-Cholecalciferol (CALCIUM 600/VITAMIN D3) 600-800 MG-UNIT TABS Take 1 tablet by mouth daily.   Yes [provider]  diltiazem (CARDIZEM CD) 120 MG 24 hr  capsule TAKE 1 CAPSULE BY MOUTH EVERY DAY 06/27/19  Yes Plotnikov, [name], MD  hydrochlorothiazide (HYDRODIURIL) 25 MG tablet TAKE 1 TABLET BY MOUTH EVERY DAY Patient taking differently: Take 25 mg by mouth daily.  04/06/19  Yes Plotnikov, [name], MD  KLOR-CON M10 10 MEQ tablet TAKE 1 TABLET (10 MEQ TOTAL) BY MOUTH 2 (TWO) TIMES DAILY. Patient taking differently: Take 10 mEq by mouth 2 (two) times daily.  04/06/19  Yes Plotnikov, [name], MD  lisinopril  (ZESTRIL) 20 MG tablet Take 20 mg by mouth daily. 08/17/19  Yes [provider]  MULTIPLE VITAMIN PO Take 1 tablet by mouth daily.    Yes [provider]  Multiple Vitamins-Minerals (CENTRUM ADULTS) TABS Take 1 tablet by mouth daily.   Yes [provider]  olmesartan (BENICAR) 20 MG tablet Take 1 tablet (20 mg total) by mouth daily. 07/15/19 07/14/20 Yes Plotnikov, [name], MD  predniSONE (DELTASONE) 20 MG tablet Take 20 mg by mouth daily with breakfast.  01/28/20  Yes [provider]  dabrafenib mesylate (TAFINLAR) 75 MG capsule Take 150 mg by mouth 2 (two) times daily. Take on an empty stomach 1 hour before or 2 hours after meals.    [provider]  ondansetron (ZOFRAN) 8 MG tablet Take 1 tablet (8 mg total) by mouth every 8 (eight) hours as needed for nausea or vomiting. Patient not taking: Reported on 03/11/2020 10/14/19   Tish Men, MD  promethazine-codeine [place] [facility] WITH CODEINE) 6.25-10 MG/5ML syrup Take 5 mLs by mouth every 4 (four) hours as needed. Patient not taking: Reported on 03/11/2020 07/15/19   Plotnikov, [name], MD  trametinib dimethyl sulfoxide (MEKINIST) 2 MG tablet Take 2 mg by mouth daily. Take 1 hour before or 2 hours after a meal. Store refrigerated in original container.    [provider]  triamcinolone lotion (KENALOG) 0.1 % Apply 1 application topically 3 (three) times daily. Patient not taking: Reported on 03/11/2020 11/25/19   Harle Stanford., PA-C    Allergies    Patient has no known allergies.  Review of Systems   Review of Systems  Constitutional: Positive for fatigue. Negative for chills and fever.  HENT: Positive for trouble swallowing. Negative for ear pain and sore throat.   Eyes: Negative for pain and visual disturbance.  Respiratory: Negative for cough and shortness of breath.   Cardiovascular: Negative for chest pain and palpitations.  Gastrointestinal: Positive for anorexia. Negative for abdominal pain,  diarrhea, nausea and vomiting.  Genitourinary: Negative for dysuria and hematuria.  Musculoskeletal: Negative for arthralgias, back pain and falls.  Skin: Negative for color change and rash.  Neurological: Positive for weakness and light-headedness. Negative for seizures and syncope.  All other systems reviewed and are negative.   Physical Exam Updated Vital Signs BP 124/64   Pulse 87   Temp 97.8 F (36.6 C) (Oral)   Resp (!) 24   Ht 5\' 4"  (1.626 m)   Wt 45.8 kg   SpO2 97%   BMI 17.34 kg/m   Physical Exam Vitals and nursing note reviewed.  Constitutional:      General: She is not in acute distress.    Appearance: She is well-developed.     Comments: Appears fatigued  HENT:     Head: Normocephalic and atraumatic.     Mouth/Throat:     Mouth: Mucous membranes are dry.  Eyes:     Conjunctiva/sclera: Conjunctivae normal.  Neck:     Comments: Large tumors  to her midline and left neck. Cardiovascular:     Rate and Rhythm: Normal rate and regular rhythm.     Heart sounds: No murmur heard.   Pulmonary:     Effort: Pulmonary effort is normal. No respiratory distress.     Breath sounds: Normal breath sounds.     Comments: Equal breath sounds bilaterally Abdominal:     Palpations: Abdomen is soft.     Tenderness: There is no abdominal tenderness.  Musculoskeletal:     Cervical back: Neck supple.     Right lower leg: No edema.     Left lower leg: No edema.  Skin:    General: Skin is warm and dry.  Neurological:     Mental Status: She is alert and oriented to person, place, and time.     Sensory: No sensory deficit.     Comments: No focal weakness, but patient has extreme fatigue with ambulating.     ED Results / Procedures / Treatments   Labs (all labs ordered are listed, but only abnormal results are displayed) Labs Reviewed  BASIC METABOLIC PANEL - Abnormal; Notable for the following components:      Result Value   Chloride 95 (*)    Glucose, Bld 163 (*)     Calcium 8.6 (*)    All other components within normal limits  CBC - Abnormal; Notable for the following components:   WBC 54.6 (*)    RBC 2.82 (*)    Hemoglobin 8.2 (*)    HCT 26.7 (*)    RDW 15.9 (*)    All other components within normal limits  HEPATIC FUNCTION PANEL - Abnormal; Notable for the following components:   Total Protein 5.4 (*)    Albumin 2.0 (*)    All other components within normal limits  RESPIRATORY PANEL BY RT PCR (FLU A&B, COVID)  CBC  CBC    EKG EKG Interpretation  Date/Time:  Friday March 11 2020 21:11:19 EDT Ventricular Rate:  88 PR Interval:    QRS Duration: 98 QT Interval:  382 QTC Calculation: 463 R Axis:   35 Text Interpretation: Sinus rhythm Ventricular trigeminy Otherwise no significant change Confirmed by Deno Etienne [phone]) on 03/11/2020 9:47:32 PM   Radiology No results found.  Procedures Procedures (including critical care time)  Medications Ordered in ED Medications  sodium chloride 0.9 % bolus 1,000 mL (0 mLs Intravenous Stopped 03/11/20 2318)    ED Course  I have reviewed the triage vital signs and the nursing notes.  Pertinent labs & imaging results that were available during my care of the patient were reviewed by me and considered in my medical decision making (see chart for details).    MDM Rules/Calculators/A&P                         WBC 54.6, similar to recent blood draws.  Hemoglobin 8.2, which is down 2 points from her last CBC.  BMP, hepatic function panel relatively unremarkable.  Suspect patient's symptoms are due to her cancer treatment.  No suspicion for infection.  She received 1 L IV fluids with some improvement in her fatigue, but patient is a still extremely lightheaded and unstable during ambulation.  Do not feel she is safe for discharge at this time.  Spoke with on-call oncologist at [facility], where she receives her cancer treatment.  He feels that if she needs to be admitted, she can be admitted here, since  there is  nothing additionally that they would have to add.  Plan to admit for further management.  This patient is seen with Dr. Tyrone Nine. Final Clinical Impression(s) / ED Diagnoses Final diagnoses:  Generalized weakness  Anemia, unspecified type  Small cell carcinoma [facility])    Rx / DC Orders ED Discharge Orders    None       Asencion Noble, MD 03/12/20 0001    Deno Etienne, DO 03/12/20 0003

## 2020-03-11 NOTE — ED Notes (Signed)
Attempted IV x2, requesting second rn to attempt

## 2020-03-12 ENCOUNTER — Encounter (HOSPITAL_COMMUNITY): Payer: Self-pay | Admitting: Family Medicine

## 2020-03-12 ENCOUNTER — Observation Stay (HOSPITAL_BASED_OUTPATIENT_CLINIC_OR_DEPARTMENT_OTHER): Payer: BC Managed Care – PPO

## 2020-03-12 DIAGNOSIS — I82C12 Acute embolism and thrombosis of left internal jugular vein: Secondary | ICD-10-CM | POA: Diagnosis present

## 2020-03-12 DIAGNOSIS — M7989 Other specified soft tissue disorders: Secondary | ICD-10-CM

## 2020-03-12 DIAGNOSIS — E876 Hypokalemia: Secondary | ICD-10-CM | POA: Diagnosis present

## 2020-03-12 DIAGNOSIS — I82622 Acute embolism and thrombosis of deep veins of left upper extremity: Secondary | ICD-10-CM | POA: Diagnosis present

## 2020-03-12 DIAGNOSIS — I10 Essential (primary) hypertension: Secondary | ICD-10-CM | POA: Diagnosis present

## 2020-03-12 DIAGNOSIS — C801 Malignant (primary) neoplasm, unspecified: Secondary | ICD-10-CM | POA: Diagnosis not present

## 2020-03-12 DIAGNOSIS — Z20822 Contact with and (suspected) exposure to covid-19: Secondary | ICD-10-CM | POA: Diagnosis present

## 2020-03-12 DIAGNOSIS — Z90710 Acquired absence of both cervix and uterus: Secondary | ICD-10-CM | POA: Diagnosis not present

## 2020-03-12 DIAGNOSIS — D630 Anemia in neoplastic disease: Secondary | ICD-10-CM | POA: Diagnosis present

## 2020-03-12 DIAGNOSIS — C7989 Secondary malignant neoplasm of other specified sites: Secondary | ICD-10-CM | POA: Diagnosis present

## 2020-03-12 DIAGNOSIS — C9110 Chronic lymphocytic leukemia of B-cell type not having achieved remission: Secondary | ICD-10-CM | POA: Diagnosis present

## 2020-03-12 DIAGNOSIS — R627 Adult failure to thrive: Secondary | ICD-10-CM | POA: Diagnosis present

## 2020-03-12 DIAGNOSIS — E785 Hyperlipidemia, unspecified: Secondary | ICD-10-CM | POA: Diagnosis present

## 2020-03-12 DIAGNOSIS — I82290 Acute embolism and thrombosis of other thoracic veins: Secondary | ICD-10-CM | POA: Diagnosis present

## 2020-03-12 DIAGNOSIS — Z7952 Long term (current) use of systemic steroids: Secondary | ICD-10-CM | POA: Diagnosis not present

## 2020-03-12 DIAGNOSIS — I82612 Acute embolism and thrombosis of superficial veins of left upper extremity: Secondary | ICD-10-CM | POA: Diagnosis present

## 2020-03-12 DIAGNOSIS — T451X5A Adverse effect of antineoplastic and immunosuppressive drugs, initial encounter: Secondary | ICD-10-CM | POA: Diagnosis present

## 2020-03-12 DIAGNOSIS — R54 Age-related physical debility: Secondary | ICD-10-CM | POA: Diagnosis present

## 2020-03-12 DIAGNOSIS — C3490 Malignant neoplasm of unspecified part of unspecified bronchus or lung: Secondary | ICD-10-CM | POA: Diagnosis present

## 2020-03-12 DIAGNOSIS — Z79899 Other long term (current) drug therapy: Secondary | ICD-10-CM | POA: Diagnosis not present

## 2020-03-12 DIAGNOSIS — E860 Dehydration: Secondary | ICD-10-CM | POA: Diagnosis present

## 2020-03-12 DIAGNOSIS — R531 Weakness: Secondary | ICD-10-CM | POA: Diagnosis present

## 2020-03-12 LAB — BASIC METABOLIC PANEL
Anion gap: 11 (ref 5–15)
BUN: 16 mg/dL (ref 8–23)
CO2: 29 mmol/L (ref 22–32)
Calcium: 7.6 mg/dL — ABNORMAL LOW (ref 8.9–10.3)
Chloride: 98 mmol/L (ref 98–111)
Creatinine, Ser: 0.78 mg/dL (ref 0.44–1.00)
GFR, Estimated: >60 mL/min (ref >60)
Glucose, Bld: 119 mg/dL — ABNORMAL HIGH (ref 70–99)
Potassium: 3 mmol/L — ABNORMAL LOW (ref 3.5–5.1)
Sodium: 138 mmol/L (ref 135–145)

## 2020-03-12 LAB — CBC WITH DIFFERENTIAL/PLATELET
Abs Immature Granulocytes: 0 10*3/uL (ref 0.00–0.07)
Basophils Absolute: 0.4 10*3/uL — ABNORMAL HIGH (ref 0.0–0.1)
Basophils Relative: 1 %
Eosinophils Absolute: 0 10*3/uL (ref 0.0–0.5)
Eosinophils Relative: 0 %
HCT: 24 % — ABNORMAL LOW (ref 36.0–46.0)
Hemoglobin: 7.5 g/dL — ABNORMAL LOW (ref 12.0–15.0)
Lymphocytes Relative: 11 %
Lymphs Abs: 4.5 10*3/uL — ABNORMAL HIGH (ref 0.7–4.0)
MCH: 30.1 pg (ref 26.0–34.0)
MCHC: 31.3 g/dL (ref 30.0–36.0)
MCV: 96.4 fL (ref 80.0–100.0)
Monocytes Absolute: 1.2 10*3/uL — ABNORMAL HIGH (ref 0.1–1.0)
Monocytes Relative: 3 %
Neutro Abs: 34.8 10*3/uL — ABNORMAL HIGH (ref 1.7–7.7)
Neutrophils Relative %: 85 %
Platelets: 271 10*3/uL (ref 150–400)
RBC: 2.49 MIL/uL — ABNORMAL LOW (ref 3.87–5.11)
RDW: 16.2 % — ABNORMAL HIGH (ref 11.5–15.5)
WBC: 40.9 10*3/uL — ABNORMAL HIGH (ref 4.0–10.5)
nRBC: 0 % (ref 0.0–0.2)

## 2020-03-12 LAB — RESPIRATORY PANEL BY RT PCR (FLU A&B, COVID)
Influenza A by PCR: NEGATIVE
Influenza B by PCR: NEGATIVE
SARS Coronavirus 2 by RT PCR: NEGATIVE

## 2020-03-12 LAB — MAGNESIUM: Magnesium: 2 mg/dL (ref 1.7–2.4)

## 2020-03-12 LAB — HEPARIN LEVEL (UNFRACTIONATED): Heparin Unfractionated: <0.1 IU/mL — ABNORMAL LOW (ref 0.30–0.70)

## 2020-03-12 MED ORDER — HEPARIN (PORCINE) 25000 UT/250ML-% IV SOLN: 850.0000 [IU]/h | INTRAVENOUS | Status: DC

## 2020-03-12 MED ORDER — ONDANSETRON HCL 4 MG PO TABS: 4.0000 mg | ORAL_TABLET | Freq: Four times a day (QID) | ORAL | Status: DC | PRN

## 2020-03-12 MED ORDER — SENNOSIDES-DOCUSATE SODIUM 8.6-50 MG PO TABS
1.0000 | ORAL_TABLET | Freq: Two times a day (BID) | ORAL | Status: DC
  Administered 2020-03-13 – 2020-03-16 (×2): 1 via ORAL
  Filled 2020-03-12 (×4): qty 1

## 2020-03-12 MED ORDER — HEPARIN (PORCINE) 25000 UT/250ML-% IV SOLN
1700.0000 [IU]/h | INTRAVENOUS | Status: DC
  Administered 2020-03-12: 1250 [IU]/h via INTRAVENOUS
  Administered 2020-03-13: 1450 [IU]/h via INTRAVENOUS
  Administered 2020-03-14: 1550 [IU]/h via INTRAVENOUS
  Filled 2020-03-12 (×2): qty 250

## 2020-03-12 MED ORDER — POTASSIUM CHLORIDE CRYS ER 10 MEQ PO TBCR
10.0000 meq | EXTENDED_RELEASE_TABLET | Freq: Two times a day (BID) | ORAL | Status: DC
  Administered 2020-03-12: 10 meq via ORAL
  Filled 2020-03-12: qty 1

## 2020-03-12 MED ORDER — HEPARIN BOLUS VIA INFUSION
1500.0000 [IU] | Freq: Once | INTRAVENOUS | Status: AC
  Administered 2020-03-12: 1500 [IU] via INTRAVENOUS
  Filled 2020-03-12: qty 1500

## 2020-03-12 MED ORDER — HEPARIN (PORCINE) 25000 UT/250ML-% IV SOLN
850.0000 [IU]/h | INTRAVENOUS | Status: DC
  Administered 2020-03-12: 850 [IU]/h via INTRAVENOUS
  Filled 2020-03-12: qty 250

## 2020-03-12 MED ORDER — ACETAMINOPHEN 325 MG PO TABS: 650.0000 mg | ORAL_TABLET | Freq: Four times a day (QID) | ORAL | Status: DC | PRN

## 2020-03-12 MED ORDER — SODIUM CHLORIDE 0.9 % IV SOLN: INTRAVENOUS | Status: AC

## 2020-03-12 MED ORDER — ENOXAPARIN SODIUM 30 MG/0.3ML ~~LOC~~ SOLN
30.0000 mg | Freq: Every day | SUBCUTANEOUS | Status: DC
  Administered 2020-03-12: 30 mg via SUBCUTANEOUS
  Filled 2020-03-12: qty 0.3

## 2020-03-12 MED ORDER — POLYETHYLENE GLYCOL 3350 17 G PO PACK: 17.0000 g | PACK | Freq: Every day | ORAL | Status: DC | PRN

## 2020-03-12 MED ORDER — SODIUM CHLORIDE 0.45 % IV SOLN: INTRAVENOUS | Status: DC

## 2020-03-12 MED ORDER — ONDANSETRON HCL 4 MG/2ML IJ SOLN: 4.0000 mg | Freq: Four times a day (QID) | INTRAMUSCULAR | Status: DC | PRN

## 2020-03-12 MED ORDER — POTASSIUM CHLORIDE CRYS ER 20 MEQ PO TBCR
40.0000 meq | EXTENDED_RELEASE_TABLET | Freq: Once | ORAL | Status: AC
  Administered 2020-03-12: 40 meq via ORAL
  Filled 2020-03-12: qty 2

## 2020-03-12 MED ORDER — ACETAMINOPHEN 650 MG RE SUPP: 650.0000 mg | Freq: Four times a day (QID) | RECTAL | Status: DC | PRN

## 2020-03-12 MED ORDER — PREDNISONE 20 MG PO TABS
20.0000 mg | ORAL_TABLET | Freq: Every day | ORAL | Status: DC
  Administered 2020-03-12 – 2020-03-16 (×5): 20 mg via ORAL
  Filled 2020-03-12 (×5): qty 1

## 2020-03-12 NOTE — Progress Notes (Signed)
ANTICOAGULATION CONSULT NOTE - Initial Consult  Pharmacy Consult for IV heparin Indication: new VTE   No Known Allergies  Patient Measurements: Height: 5\' 4"  (162.6 cm) Weight: 50.3 kg (110 lb 14.4 oz) (scale c) IBW/kg (Calculated) : 54.7 Heparin Dosing Weight: 50.3kg  Vital Signs: Temp: 98 F (36.7 C) (10/09 1938) Temp Source: Oral (10/09 1938) BP: 111/51 (10/09 1938) Pulse Rate: 81 (10/09 1938)  Labs: Recent Labs    03/11/20 1830 03/11/20 1830 03/11/20 2046 03/12/20 0720 03/12/20 2041  HGB QUESTIONABLE RESULTS, RECOMMEND RECOLLECT TO VERIFY   < > 8.2* 7.5*  --   HCT QUESTIONABLE RESULTS, RECOMMEND RECOLLECT TO VERIFY  --  26.7* 24.0*  --   PLT QUESTIONABLE RESULTS, RECOMMEND RECOLLECT TO VERIFY  --  291 271  --   HEPARINUNFRC  --   --   --   --  <0.10*  CREATININE 0.85  --   --  0.78  --    < > = values in this interval not displayed.    Estimated Creatinine Clearance: 51.2 mL/min (by C-G formula based on SCr of 0.78 mg/dL).   Medical History: Past Medical History:  Diagnosis Date  . Borderline hyperlipidemia   . Hypertension   . NSCL Ca Dx'd 08/2019  . Tonsillitis 12/29/2011  . Varicose vein     Medications:  Scheduled:  . predniSONE  20 mg Oral Q breakfast  . senna-docusate  1 tablet Oral BID    Assessment: 69yoF starting IV heparin for new VTE. Patient reports 2-3 days of left arm swelling. [place] consistent with extensive DVTs of left upper extremity. PMH s/f metastatic squamous cell carcinoma followed by [facility] hematology.  -initial heparin level undetectable -hg= 7.5  Goal of Therapy:  Heparin level 0.3-0.7 units/ml Monitor platelets by anticoagulation protocol: Yes   Plan:  Heparin bolus 1500 units x1 and increase infusion to 1250 units/hr Check anti-Xa level in 8 hours and daily while on heparin Continue to monitor H&H and platelets  Hildred Laser, PharmD Clinical Pharmacist **Pharmacist phone directory can now be found on [email or web address] ([email or web address]).   Listed under [place].

## 2020-03-12 NOTE — Evaluation (Signed)
Physical Therapy Evaluation Patient Details Name: [facility] MRN: [id number] DOB: 08-26-1950 Today's Date: 03/12/2020   History of Present Illness  The pt is a 69 yo female presenting with decreased appetite and weakness following start of a new chemotherapy regiment a few days ago. She is undergoing continued workup for weakness and LUE swelling. PMH includes: metastatic squamous cell carcinoma with suspected lung primary, CLL, and HTN.     Clinical Impression  Pt in bed upon arrival of PT, agreeable to evaluation at this time. Prior to recent change in chemotherapy, the pt was completely independent with mobility and ADLs without use of AD. Within the last week, the pt has been limited to short distances with reliance on support from furniture or her son to mobilize in the home. The pt now presents with limitations in functional mobility, strength, power, activity tolerance, and dynamic stability, and will continue to benefit from skilled PT to address these deficits. The pt was able to complete bed mobility without assist, as well as initial transfers with minG (using AD) or minA (without AD), and was limited to ~20 ft ambulation at a time with constant assist to steady. The pt was educated on progressive exercises and return to mobility, importance of son to assist for safety, and given HEP of bed and chair-level exercises. The pt will continue to benefit from skilled PT in short term to facilitate return of strength through guided exercise and mobility progression.      Follow Up Recommendations Home health PT;Supervision for mobility/OOB    Equipment Recommendations  Rolling walker with 5" wheels (tub bench)    Recommendations for Other Services       Precautions / Restrictions Precautions Precautions: Fall Restrictions Weight Bearing Restrictions: No      Mobility  Bed Mobility Overal bed mobility: Modified Independent             General bed mobility comments:  increased time and some use of bed rails, but no assist needed  Transfers Overall transfer level: Needs assistance Equipment used: Rolling walker (2 wheeled);1 person hand held assist Transfers: Sit to/from Stand Sit to Stand: Min guard;Min assist         General transfer comment: minG with RW or minA with HHA. No LOB but pt with very slow rise and minA to steady  Ambulation/Gait Ambulation/Gait assistance: Min guard;Min assist Gait Distance (Feet): 20 Feet (x2) Assistive device: Rolling walker (2 wheeled);1 person hand held assist Gait Pattern/deviations: Step-through pattern;Decreased stride length Gait velocity: decreased Gait velocity interpretation: <1.31 ft/sec, indicative of household ambulator General Gait Details: very slow, unsteady gait with tentative steps when using HHA, slow gait with narrow BOS with ues of RW, minG for balance only  Stairs Stairs:  (pt does have 6 steps to enter)              Balance Overall balance assessment: Mild deficits observed, not formally tested                                           Pertinent Vitals/Pain Pain Assessment: No/denies pain    Home Living Family/patient expects to be discharged to:: Private residence Living Arrangements: Children (son) Available Help at Discharge: Family;Available 24 hours/day (son on family leave) Type of Home: House Home Access: Stairs to enter Entrance Stairs-Rails: Left Entrance Stairs-Number of Steps: 6 Home Layout: One level Home Equipment:  Grab bars - tub/shower;Bedside commode      Prior Function Level of Independence: Independent         Comments: prior to start of new medication, pt [place] independent[place] beach, walking without AD. In past week the pt was limited to short distances with furniture walking and assist of son for home-making, reports "very limited" in ALDs due to lack of help     Hand Dominance   Dominant Hand: Right    Extremity/Trunk  Assessment   Upper Extremity Assessment Upper Extremity Assessment: Generalized weakness (significant welling LUE)    Lower Extremity Assessment Lower Extremity Assessment: Generalized weakness    Cervical / Trunk Assessment Cervical / Trunk Assessment: Kyphotic;Other exceptions Cervical / Trunk Exceptions: noted mass on anterior aspect of neck  Communication   Communication: No difficulties  Cognition Arousal/Alertness: Awake/alert Behavior During Therapy: WFL for tasks assessed/performed Overall Cognitive Status: Within Functional Limits for tasks assessed                                        General Comments General comments (skin integrity, edema, etc.): significant edema LUE, provided HEP for bed and chair-level exercises        Assessment/Plan    PT Assessment Patient needs continued PT services  PT Problem List Decreased strength;Decreased activity tolerance;Decreased balance;Decreased mobility;Decreased knowledge of use of DME       PT Treatment Interventions DME instruction;Gait training;Stair training;Functional mobility training;Therapeutic activities;Therapeutic exercise;Balance training;Patient/family education    PT Goals (Current goals can be found in the Care Plan section)  Acute Rehab PT Goals Patient Stated Goal: improve strength PT Goal Formulation: With patient Time For Goal Achievement: 03/26/20 Potential to Achieve Goals: Fair    Frequency Min 3X/week   Barriers to discharge           AM-PAC PT "6 Clicks" Mobility  Outcome Measure Help needed turning from your back to your side while in a flat bed without using bedrails?: None Help needed moving from lying on your back to sitting on the side of a flat bed without using bedrails?: None Help needed moving to and from a bed to a chair (including a wheelchair)?: A Little Help needed standing up from a chair using your arms (e.g., wheelchair or bedside chair)?: A Little Help  needed to walk in hospital room?: A Little Help needed climbing 3-5 steps with a railing? : A Lot 6 Click Score: 19    End of Session Equipment Utilized During Treatment: Gait belt Activity Tolerance: Patient tolerated treatment well Patient left: in bed (with transport) Nurse Communication: Mobility status PT Visit Diagnosis: Unsteadiness on feet (R26.81);Muscle weakness (generalized) (M62.81);Difficulty in walking, not elsewhere classified (R26.2)    Time: 9371-6967 PT Time Calculation (min) (ACUTE ONLY): 17 min   Charges:   PT Evaluation $PT Eval Low Complexity: 1 Low          Karma Ganja, PT, DPT   Acute Rehabilitation Department Pager #: [phone]  Otho Bellows 03/12/2020, 9:26 AM

## 2020-03-12 NOTE — Progress Notes (Signed)
Left upper extremity venous duplex completed. Refer to "CV Proc" under chart review to view preliminary results.  Preliminary results discussed with Dr. Erlinda Hong.  03/12/2020 9:44 AM Kelby Aline., MHA, RVT, RDCS, RDMS

## 2020-03-12 NOTE — Progress Notes (Signed)
PROGRESS NOTE    Jillian Hartman  TKZ:[id number] DOB: [birth year] DOA: 03/11/2020 PCP: Cassandria Anger, MD    Chief Complaint  Patient presents with  . Weakness    Brief Narrative:  Chief Complaint: General weakness, fatigue, malaise   HPI: Jillian Hartman is a 69 y.o. female with medical history significant for metastatic squamous cell carcinoma with suspected lung primary, CLL, and hypertension, now presenting to the emergency department for evaluation of malaise, general weakness, and fatigue.  Patient is followed by [facility] hematology oncology for metastatic squamous cell carcinoma that is suspected to originate from the lung, had been on oral therapies until 2 weeks ago when she received a first infusion of carboplatin, paclitaxel, and pembrolizumab.  Since then, she has had a very poor appetite and profound fatigue.  She has discomfort from large neck masses, but reports that she normally tolerates a regular diet and does not choke on food.  She has not been vomiting but has some mild nausea.  She denies any focal numbness or weakness, denies change in vision or hearing, but has been generally weak and fatigued.  She notified her oncology clinic of her symptoms and it was advised that she present to the emergency department at [facility] for evaluation.  She has been vaccinated against Covid and denies any fevers, chills, or respiratory symptoms.  She denies melena or hematochezia.  ED Course: Upon arrival to the ED, patient is found to be afebrile, saturating well on room air, mildly tachycardic initially and with blood pressure 91 systolic.  EKG features sinus rhythm with PVCs.  Chemistry panel notable for albumin of 2.0.  CBC features a leukocytosis to 54,600 and a normocytic anemia with hemoglobin 8.2.  Covid screening test not yet resulted.  Patient was given a liter of saline in the ED.  ED physician reports discussing the case with oncologist at [facility] who advised admitting the  patient here.  Subjective:  Remain very weak, no appetite Denies ab pain, no n/v, no dysuria, no diarrhea  [place] the beach, now she could not get up to use the bedside commode due to weakness  ( son reports patient was not able to walk at the beach, the last time she was at the beach was around labor day )  Reports left arm swollen, denies pain  Left neck nodules redness, denies pain  She denies difficulty swallowing, but son reports patient does has problem swallowing  Patient does appear to have confusion, she is not a reliable historian, son confirmed patient started to have confusion a few months ago  Assessment & Plan:   Principal Problem:   General weakness Active Problems:   Essential hypertension   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  Supportive treatment  Hypokalemia: Replace K, recheck in the morning, check magnesium  Leukocytosis -Denies recent use of G-CSF - sHe is on steroid  -Possibly reactive, no sign of infection -she also has h/o of CLL, will check ldh -Repeat in  the morning  Normocytic anemia -Last chemo on 9/24 - will check fobt  History of hypertension Currently blood pressure low normal Hold all blood pressure medication for now She does appear dehydrated, continue hydration for another 24 hours  metastatic squamous cell carcinoma  She is not sure that she wants to receive chemotherapy again given side-effects from infusion 2 wks ago and  plans to discuss with her oncologist  She said she is given prednisone for this -will need to contact her oncology Dr Johnnye Sima at [phone] on Monday for recommendation, transfer vs home with clinic follow up. -will get speech eval due to concerns for difficulty swallowing    FTT, progressive weakness, poor oral intake, ongoing confusion , weight loss (patient denies , but son confirmed ) overall very poor prognosis Patient currently is declining home health    DVT prophylaxis: enoxaparin (LOVENOX) injection 30 mg Start: 03/12/20 1000   Code Status:full Family Communication: Son over the phone with his permission Disposition:   Status is: Inpatient   Dispo: The patient is from: Home              Anticipated d/c is to: TBD              Anticipated d/c date is: TBD, if symptom improves, tolerate anticoagulation without bleeding, monitor hemoglobin, need to transition to Lovenox prior to discharge, needs Lovenox teaching              Patient currently not medically stable to discharge, patient has tendency to minimize symptom, I got permission to talk to her son , son reports patient is very weak,has been in bed for almost 12days  Consultants:   Case discussed with hematology Dr. Irene Limbo over the phone  Procedures:   none  Antimicrobials:    none    Objective: Vitals:   03/12/20 0200 03/12/20 0300 03/12/20 0354 03/12/20 0752  BP: (!) 106/58 (!) 105/50 (!) 116/45 (!) 122/55  Pulse:  84 96 90  Resp: 15 16 16 18   Temp:   98.4 F (36.9 C) 98.7 F (37.1 C)  TempSrc:   Oral Oral  SpO2:  97% 97% 95%  Weight:   50.3 kg   Height:        Intake/Output Summary (Last 24 hours) at 03/12/2020 0824 Last data filed at 03/12/2020 0804 Gross per 24 hour  Intake 1459.84 ml  Output 300 ml  Net 1159.84 ml   Filed Weights   03/11/20 1809 03/12/20 0354  Weight: 45.8 kg 50.3 kg    Examination:  General exam: frail, weak, appear slightly confused Respiratory system: Clear to  auscultation. Respiratory effort normal. Cardiovascular system: S1 & S2 heard, RRR. No JVD, no murmur, No pedal edema. Gastrointestinal system: Abdomen is nondistended, soft and nontender. No organomegaly or masses felt. Normal bowel sounds heard. Central nervous system: Alert and oriented to place the year, not to the month. No focal neurological deficits. Extremities: generalized weakness Skin: No rashes, lesions or ulcers Psychiatry: pleasant, cooperative, but does appear slightly confused    Data Reviewed: I have personally reviewed following labs and imaging studies  CBC: Recent Labs  Lab 03/11/20 1830 03/11/20 2046 03/12/20 0720  WBC QUANTITY NOT SUFFICIENT, UNABLE TO PERFORM TEST 54.6* 40.9*  NEUTROABS  --   --  PENDING  HGB QUESTIONABLE RESULTS, RECOMMEND RECOLLECT TO VERIFY 8.2* 7.5*  HCT QUESTIONABLE RESULTS, RECOMMEND RECOLLECT TO VERIFY 26.7* 24.0*  MCV QUESTIONABLE RESULTS, RECOMMEND RECOLLECT TO VERIFY 94.7 96.4  PLT QUESTIONABLE RESULTS, RECOMMEND  RECOLLECT TO VERIFY 291 956    Basic Metabolic Panel: Recent Labs  Lab 03/11/20 1830 03/12/20 0720  NA 137 138  K 3.6 3.0*  CL 95* 98  CO2 27 29  GLUCOSE 163* 119*  BUN 19 16  CREATININE 0.85 0.78  CALCIUM 8.6* 7.6*  MG  --  2.0    GFR: Estimated Creatinine Clearance: 51.2 mL/min (by C-G formula based on SCr of 0.78 mg/dL).  Liver Function Tests: Recent Labs  Lab 03/11/20 1830  AST 18  ALT 12  ALKPHOS 110  BILITOT 0.4  PROT 5.4*  ALBUMIN 2.0*    CBG: No results for input(s): GLUCAP in the last 168 hours.   Recent Results (from the past 240 hour(s))  Respiratory Panel by RT PCR (Flu A&B, Covid) - Nasopharyngeal Swab     Status: None   Collection Time: 03/11/20 11:36 PM   Specimen: Nasopharyngeal Swab  Result Value Ref Range Status   SARS Coronavirus 2 by RT PCR NEGATIVE NEGATIVE Final    Comment: (NOTE) SARS-CoV-2 target nucleic acids are NOT DETECTED.  The SARS-CoV-2 RNA is generally  detectable in upper respiratoy specimens during the acute phase of infection. The lowest concentration of SARS-CoV-2 viral copies this assay can detect is 131 copies/mL. A negative result does not preclude SARS-Cov-2 infection and should not be used as the sole basis for treatment or other patient management decisions. A negative result may occur with  improper specimen collection/handling, submission of specimen other than nasopharyngeal swab, presence of viral mutation(s) within the areas targeted by this assay, and inadequate number of viral copies (<131 copies/mL). A negative result must be combined with clinical observations, patient history, and epidemiological information. The expected result is Negative.  Fact Sheet for Patients:  PinkCheek.be  Fact Sheet for Healthcare Providers:  GravelBags.it  This test is no t yet approved or cleared by the [place] FDA and  has been authorized for detection and/or diagnosis of SARS-CoV-2 by FDA under an Emergency Use Authorization (EUA). This EUA will remain  in effect (meaning this test can be used) for the duration of the COVID-19 declaration under Section 564(b)(1) of the Act, 21 U.S.C. section 360bbb-3(b)(1), unless the authorization is terminated or revoked sooner.     Influenza A by PCR NEGATIVE NEGATIVE Final   Influenza B by PCR NEGATIVE NEGATIVE Final    Comment: (NOTE) The Xpert Xpress SARS-CoV-2/FLU/RSV assay is intended as an aid in  the diagnosis of influenza from Nasopharyngeal swab specimens and  should not be used as a sole basis for treatment. Nasal washings and  aspirates are unacceptable for Xpert Xpress SARS-CoV-2/FLU/RSV  testing.  Fact Sheet for Patients: PinkCheek.be  Fact Sheet for Healthcare Providers: GravelBags.it  This test is not yet approved or cleared by the [place] FDA and    has been authorized for detection and/or diagnosis of SARS-CoV-2 by  FDA under an Emergency Use Authorization (EUA). This EUA will remain  in effect (meaning this test can be used) for the duration of the  Covid-19 declaration under Section 564(b)(1) of the Act, 21  U.S.C. section 360bbb-3(b)(1), unless the authorization is  terminated or revoked. Performed at [facility], [place] [address], [place], [place] [address]          Radiology Studies: No results found.      Scheduled Meds: . enoxaparin (LOVENOX) injection  30 mg Subcutaneous Daily  . potassium chloride  10 mEq Oral BID  . predniSONE  20 mg  Oral Q breakfast   Continuous Infusions: . sodium chloride 100 mL/hr at 03/12/20 0418     LOS: 0 days   Time spent: 35 mins Greater than 50% of this time was spent in counseling, explanation of diagnosis, planning of further management, and coordination of care.  I have personally reviewed and interpreted on  03/12/2020 daily labs, tele strips, imagings as discussed above under date review session and assessment and plans.  I reviewed all nursing notes, pharmacy notes, vitals, pertinent old records  I have discussed plan of care as described above with RN , patient and family on 03/12/2020  Voice Recognition /Dragon dictation system was used to create this note, attempts have been made to correct errors. Please contact the author with questions and/or clarifications.   Florencia Reasons, MD PhD FACP [facility]  Available via Epic secure chat 7am-7pm for nonurgent issues Please page for urgent issues To page the attending provider between 7A-7P or the covering provider during after hours 7P-7A, please log into the web site [email or web address] and access using universal [place] password for that web site. If you do not have the password, please call the hospital operator.    03/12/2020, 8:24 AM

## 2020-03-12 NOTE — Progress Notes (Signed)
ANTICOAGULATION CONSULT NOTE - Initial Consult  Pharmacy Consult for IV heparin Indication: new VTE   No Known Allergies  Patient Measurements: Height: 5\' 4"  (162.6 cm) Weight: 50.3 kg (110 lb 14.4 oz) (scale c) IBW/kg (Calculated) : 54.7 Heparin Dosing Weight: 50.3kg  Vital Signs: Temp: 98.7 F (37.1 C) (10/09 0752) Temp Source: Oral (10/09 0752) BP: 122/55 (10/09 0752) Pulse Rate: 90 (10/09 0752)  Labs: Recent Labs    03/11/20 1830 03/11/20 1830 03/11/20 2046 03/12/20 0720  HGB QUESTIONABLE RESULTS, RECOMMEND RECOLLECT TO VERIFY   < > 8.2* 7.5*  HCT QUESTIONABLE RESULTS, RECOMMEND RECOLLECT TO VERIFY  --  26.7* 24.0*  PLT QUESTIONABLE RESULTS, RECOMMEND RECOLLECT TO VERIFY  --  291 271  CREATININE 0.85  --   --  0.78   < > = values in this interval not displayed.    Estimated Creatinine Clearance: 51.2 mL/min (by C-G formula based on SCr of 0.78 mg/dL).   Medical History: Past Medical History:  Diagnosis Date  . Borderline hyperlipidemia   . Hypertension   . NSCL Ca Dx'd 08/2019  . Tonsillitis 12/29/2011  . Varicose vein     Medications:  Scheduled:  . enoxaparin (LOVENOX) injection  30 mg Subcutaneous Daily  . potassium chloride  10 mEq Oral BID  . predniSONE  20 mg Oral Q breakfast    Assessment: 69yoF starting IV heparin for new VTE. Patient reports 2-3 days of left arm swelling. [place] consistent with extensive DVTs of left upper extremity. PMH s/f metastatic squamous cell carcinoma followed by [facility] hematology. Hgb low, 7.5. PLT WNL. No s/sx bleeding charted. Will skip bolus given recent enoxaparin administration.   Goal of Therapy:  Heparin level 0.3-0.7 units/ml Monitor platelets by anticoagulation protocol: Yes   Plan:  D/c enoxaparin  Start heparin infusion at 850 units/hr Check anti-Xa level in 8 hours and daily while on heparin Continue to monitor H&H and platelets  Mercy Riding, PharmD PGY1 Acute Care Pharmacy Resident Please refer to  [facility] for unit-specific pharmacist

## 2020-03-12 NOTE — H&P (Signed)
History and Physical    Harbour Nordmeyer UDT:[id number] DOB: 12-16-1950 DOA: 03/11/2020  PCP: Cassandria Anger, MD   Patient coming from: Home   Chief Complaint: General weakness, fatigue, malaise   HPI: Medina Degraffenreid is a 69 y.o. female with medical history significant for metastatic squamous cell carcinoma with suspected lung primary, CLL, and hypertension, now presenting to the emergency department for evaluation of malaise, general weakness, and fatigue.  Patient is followed by [facility] hematology oncology for metastatic squamous cell carcinoma that is suspected to originate from the lung, had been on oral therapies until 2 weeks ago when she received a first infusion of carboplatin, paclitaxel, and pembrolizumab.  Since then, she has had a very poor appetite and profound fatigue.  She has discomfort from large neck masses, but reports that she normally tolerates a regular diet and does not choke on food.  She has not been vomiting but has some mild nausea.  She denies any focal numbness or weakness, denies change in vision or hearing, but has been generally weak and fatigued.  She notified her oncology clinic of her symptoms and it was advised that she present to the emergency department at [facility] for evaluation.  She has been vaccinated against Covid and denies any fevers, chills, or respiratory symptoms.  She denies melena or hematochezia.  ED Course: Upon arrival to the ED, patient is found to be afebrile, saturating well on room air, mildly tachycardic initially and with blood pressure 91 systolic.  EKG features sinus rhythm with PVCs.  Chemistry panel notable for albumin of 2.0.  CBC features a leukocytosis to 54,600 and a normocytic anemia with hemoglobin 8.2.  Covid screening test not yet resulted.  Patient was given a liter of saline in the ED.  ED physician reports discussing the case with oncologist at [facility] who advised admitting the patient here.  Review of Systems:  All  other systems reviewed and apart from HPI, are negative.  Past Medical History:  Diagnosis Date  . Borderline hyperlipidemia   . Hypertension   . NSCL Ca Dx'd 08/2019  . Tonsillitis 12/29/2011  . Varicose vein     Past Surgical History:  Procedure Laterality Date  . ABDOMINAL HYSTERECTOMY    . CERVICAL CONIZATION W/BX     '77 after abnormal pap  . LAPAROSCOPY     '82 fertility work up    Social History:   reports that she has never smoked. She has never used smokeless tobacco. She reports current alcohol use. She reports that she does not use drugs.  No Known Allergies  Family History  Problem Relation Age of Onset  . Hypertension Mother   . Hypertension Father   . Ulcers Father   . Cancer Father        oral cancer     Prior to Admission medications   Medication Sig Start Date End Date Taking? Authorizing Provider  Calcium Carb-Cholecalciferol (CALCIUM 600/VITAMIN D3) 600-800 MG-UNIT TABS Take 1 tablet by mouth daily.   Yes [provider]  diltiazem (CARDIZEM CD) 120 MG 24 hr capsule TAKE 1 CAPSULE BY MOUTH EVERY DAY 06/27/19  Yes Plotnikov, [name], MD  hydrochlorothiazide (HYDRODIURIL) 25 MG tablet TAKE 1 TABLET BY MOUTH EVERY DAY Patient taking differently: Take 25 mg by mouth daily.  04/06/19  Yes Plotnikov, [name], MD  KLOR-CON M10 10 MEQ tablet TAKE 1 TABLET (10 MEQ TOTAL) BY MOUTH 2 (TWO) TIMES DAILY. Patient taking differently: Take 10 mEq by  mouth 2 (two) times daily.  04/06/19  Yes Plotnikov, [name], MD  lisinopril (ZESTRIL) 20 MG tablet Take 20 mg by mouth daily. 08/17/19  Yes [provider]  MULTIPLE VITAMIN PO Take 1 tablet by mouth daily.    Yes [provider]  Multiple Vitamins-Minerals (CENTRUM ADULTS) TABS Take 1 tablet by mouth daily.   Yes [provider]  olmesartan (BENICAR) 20 MG tablet Take 1 tablet (20 mg total) by mouth daily. 07/15/19 07/14/20 Yes Plotnikov, [name], MD  predniSONE (DELTASONE) 20 MG  tablet Take 20 mg by mouth daily with breakfast.  01/28/20  Yes [provider]  dabrafenib mesylate (TAFINLAR) 75 MG capsule Take 150 mg by mouth 2 (two) times daily. Take on an empty stomach 1 hour before or 2 hours after meals.    [provider]  ondansetron (ZOFRAN) 8 MG tablet Take 1 tablet (8 mg total) by mouth every 8 (eight) hours as needed for nausea or vomiting. Patient not taking: Reported on 03/11/2020 10/14/19   Tish Men, MD  promethazine-codeine [facility] WITH CODEINE) 6.25-10 MG/5ML syrup Take 5 mLs by mouth every 4 (four) hours as needed. Patient not taking: Reported on 03/11/2020 07/15/19   Plotnikov, [name], MD  trametinib dimethyl sulfoxide (MEKINIST) 2 MG tablet Take 2 mg by mouth daily. Take 1 hour before or 2 hours after a meal. Store refrigerated in original container.    [provider]  triamcinolone lotion (KENALOG) 0.1 % Apply 1 application topically 3 (three) times daily. Patient not taking: Reported on 03/11/2020 11/25/19   Harle Stanford., PA-C    Physical Exam: Vitals:   03/11/20 2200 03/11/20 2245 03/11/20 2300 03/11/20 2315  BP: 112/60 119/64 122/61 124/64  Pulse: 82 98 92 87  Resp: 17 18 (!) 21 (!) 24  Temp:      TempSrc:      SpO2: 96% 96% 96% 97%  Weight:      Height:        Constitutional: NAD, calm  Eyes: PERTLA, lids and conjunctivae normal ENMT: Mucous membranes are moist. Posterior pharynx clear of any exudate or lesions.   Neck: Non-tender masses, supple  Respiratory:  no wheezing, no crackles. No accessory muscle use.  Cardiovascular: S1 & S2 heard, regular rate and rhythm. No lower extremity edema.   Abdomen: No distension, no tenderness, soft. Bowel sounds active.  Musculoskeletal: Left arm edema. No joint deformity upper and lower extremities.   Skin: no significant rashes, lesions, ulcers. Warm, dry, well-perfused. Neurologic: CN 2-12 grossly intact. Sensation intact. Moving all extremities, no focal weakness.    Psychiatric: Alert and oriented to person, place, and situation. Very pleasant and cooperative.    Labs and Imaging on Admission: I have personally reviewed following labs and imaging studies  CBC: Recent Labs  Lab 03/11/20 1830 03/11/20 2046  WBC QUANTITY NOT SUFFICIENT, UNABLE TO PERFORM TEST 54.6*  HGB QUESTIONABLE RESULTS, RECOMMEND RECOLLECT TO VERIFY 8.2*  HCT QUESTIONABLE RESULTS, RECOMMEND RECOLLECT TO VERIFY 26.7*  MCV QUESTIONABLE RESULTS, RECOMMEND RECOLLECT TO VERIFY 94.7  PLT QUESTIONABLE RESULTS, RECOMMEND RECOLLECT TO VERIFY 932   Basic Metabolic Panel: Recent Labs  Lab 03/11/20 1830  NA 137  K 3.6  CL 95*  CO2 27  GLUCOSE 163*  BUN 19  CREATININE 0.85  CALCIUM 8.6*   GFR: Estimated Creatinine Clearance: 43.9 mL/min (by C-G formula based on SCr of 0.85 mg/dL). Liver Function Tests: Recent Labs  Lab 03/11/20 1830  AST 18  ALT  12  ALKPHOS 110  BILITOT 0.4  PROT 5.4*  ALBUMIN 2.0*   No results for input(s): LIPASE, AMYLASE in the last 168 hours. No results for input(s): AMMONIA in the last 168 hours. Coagulation Profile: No results for input(s): INR, PROTIME in the last 168 hours. Cardiac Enzymes: No results for input(s): CKTOTAL, CKMB, CKMBINDEX, TROPONINI in the last 168 hours. BNP (last 3 results) No results for input(s): PROBNP in the last 8760 hours. HbA1C: No results for input(s): HGBA1C in the last 72 hours. CBG: No results for input(s): GLUCAP in the last 168 hours. Lipid Profile: No results for input(s): CHOL, HDL, LDLCALC, TRIG, CHOLHDL, LDLDIRECT in the last 72 hours. Thyroid Function Tests: No results for input(s): TSH, T4TOTAL, FREET4, T3FREE, THYROIDAB in the last 72 hours. Anemia Panel: No results for input(s): VITAMINB12, FOLATE, FERRITIN, TIBC, IRON, RETICCTPCT in the last 72 hours. Urine analysis:    Component Value Date/Time   COLORURINE YELLOW 07/15/2019 1543   APPEARANCEUR Cloudy (A) 07/15/2019 1543   LABSPEC >=1.030  (A) 07/15/2019 1543   PHURINE 5.5 07/15/2019 1543   GLUCOSEU NEGATIVE 07/15/2019 1543   HGBUR MODERATE (A) 07/15/2019 1543   BILIRUBINUR NEGATIVE 07/15/2019 1543   BILIRUBINUR negative 10/25/2011 1605   KETONESUR TRACE (A) 07/15/2019 1543   PROTEINUR negative 10/25/2011 1605   UROBILINOGEN 0.2 07/15/2019 1543   NITRITE NEGATIVE 07/15/2019 1543   LEUKOCYTESUR TRACE (A) 07/15/2019 1543   Sepsis Labs: @LABRCNTIP (procalcitonin:4,lacticidven:4) )No results found for this or any previous visit (from the past 240 hour(s)).   Radiological Exams on Admission: No results found.  EKG: Independently reviewed. Sinus rhythm, PVCs.   Assessment/Plan   1. General weakness  - Presents with persistent fatigue, general weakness, and loss of appetite that began after chemotherapy infusion  - She reports improvement with IVF in ED but continues to feel too weak to get out of bed on her own  - There is no focal neuro deficits  - Continue IVF hydration and supportive care, consult PT    2. Metastatic squamous cell carcinoma; CLL  - Managed by [facility]emat[facility] Oncology  - She is not sure that she wants to receive chemotherapy again given side-effects from infusion 2 wks ago and plans to discuss with her oncologist  - Continue prednisone for now    3. Hypertension  - SBP low 90s initially and antihypertensives held on admission   4. Anemia; leukocytosis  - WBC is 54.6k on admission, was 51.1k two weeks ago - Hgb is 8.2 on admission, down from 10.0 two weeks ago  - There is no apparent bleeding and no evidence for infection  - Repeat CBC in am, culture if febrile   5. Left arm swelling  - Patient reports 2-3 days of left arm swelling without discoloration or tenderness  - Check venous US, elevated arm    DVT prophylaxis: Lovenox  Code Status: Full for now. Discussed with patient at time of admission and she wants to discus with her son who is POA.  Family Communication: Discussed with  patient  Disposition Plan:  Patient is from: Home  Anticipated d/c is to: TBD Anticipated d/c date is: 03/12/20 Patient currently: Receiving IVF, pending PT eval  Consults called: None; ED physician discussed with [facility] oncology  Admission status: Observation     Vianne Bulls, MD [facility]  03/12/2020, 12:25 AM

## 2020-03-13 LAB — BASIC METABOLIC PANEL
Anion gap: 11 (ref 5–15)
BUN: 14 mg/dL (ref 8–23)
CO2: 26 mmol/L (ref 22–32)
Calcium: 7.6 mg/dL — ABNORMAL LOW (ref 8.9–10.3)
Chloride: 101 mmol/L (ref 98–111)
Creatinine, Ser: 0.75 mg/dL (ref 0.44–1.00)
GFR, Estimated: >60 mL/min (ref >60)
Glucose, Bld: 138 mg/dL — ABNORMAL HIGH (ref 70–99)
Potassium: 3.4 mmol/L — ABNORMAL LOW (ref 3.5–5.1)
Sodium: 138 mmol/L (ref 135–145)

## 2020-03-13 LAB — HEPARIN LEVEL (UNFRACTIONATED)
Heparin Unfractionated: 0.12 IU/mL — ABNORMAL LOW (ref 0.30–0.70)
Heparin Unfractionated: 0.27 IU/mL — ABNORMAL LOW (ref 0.30–0.70)

## 2020-03-13 LAB — CBC
HCT: 24.2 % — ABNORMAL LOW (ref 36.0–46.0)
Hemoglobin: 7.4 g/dL — ABNORMAL LOW (ref 12.0–15.0)
MCH: 29.6 pg (ref 26.0–34.0)
MCHC: 30.6 g/dL (ref 30.0–36.0)
MCV: 96.8 fL (ref 80.0–100.0)
Platelets: 256 10*3/uL (ref 150–400)
RBC: 2.5 MIL/uL — ABNORMAL LOW (ref 3.87–5.11)
RDW: 16.2 % — ABNORMAL HIGH (ref 11.5–15.5)
WBC: 40.5 10*3/uL — ABNORMAL HIGH (ref 4.0–10.5)
nRBC: 0 % (ref 0.0–0.2)

## 2020-03-13 LAB — MAGNESIUM: Magnesium: 1.8 mg/dL (ref 1.7–2.4)

## 2020-03-13 LAB — LACTATE DEHYDROGENASE: LDH: 238 U/L — ABNORMAL HIGH (ref 98–192)

## 2020-03-13 LAB — OCCULT BLOOD X 1 CARD TO LAB, STOOL: Fecal Occult Bld: NEGATIVE

## 2020-03-13 MED ORDER — SODIUM CHLORIDE 0.9 % IV SOLN: INTRAVENOUS | Status: AC

## 2020-03-13 MED ORDER — POTASSIUM CHLORIDE CRYS ER 20 MEQ PO TBCR
40.0000 meq | EXTENDED_RELEASE_TABLET | Freq: Once | ORAL | Status: AC
  Administered 2020-03-13: 40 meq via ORAL
  Filled 2020-03-13: qty 2

## 2020-03-13 MED ORDER — MAGNESIUM SULFATE IN D5W 1-5 GM/100ML-% IV SOLN
1.0000 g | Freq: Once | INTRAVENOUS | Status: AC
  Administered 2020-03-13: 1 g via INTRAVENOUS
  Filled 2020-03-13: qty 100

## 2020-03-13 MED ORDER — HEPARIN BOLUS VIA INFUSION
2000.0000 [IU] | Freq: Once | INTRAVENOUS | Status: AC
  Administered 2020-03-13: 2000 [IU] via INTRAVENOUS
  Filled 2020-03-13: qty 2000

## 2020-03-13 NOTE — Progress Notes (Signed)
PROGRESS NOTE    Jillian Hartman  WIO:[id number] DOB: Apr 29, 1951 DOA: 03/11/2020 PCP: Cassandria Anger, MD    Chief Complaint  Patient presents with  . Weakness    Brief Narrative:  Chief Complaint: General weakness, fatigue, malaise   HPI: Jillian Hartman is a 69 y.o. female with medical history significant for metastatic squamous cell carcinoma with suspected lung primary, CLL, and hypertension, now presenting to the emergency department for evaluation of malaise, general weakness, and fatigue.  Patient is followed by [facility] hematology oncology for metastatic squamous cell carcinoma that is suspected to originate from the lung, had been on oral therapies until 2 weeks ago when she received a first infusion of carboplatin, paclitaxel, and pembrolizumab.  Since then, she has had a very poor appetite and profound fatigue.  She has discomfort from large neck masses, but reports that she normally tolerates a regular diet and does not choke on food.  She has not been vomiting but has some mild nausea.  She denies any focal numbness or weakness, denies change in vision or hearing, but has been generally weak and fatigued.  She notified her oncology clinic of her symptoms and it was advised that she present to the emergency department at [facility] for evaluation.  She has been vaccinated against Covid and denies any fevers, chills, or respiratory symptoms.  She denies melena or hematochezia.  ED Course: Upon arrival to the ED, patient is found to be afebrile, saturating well on room air, mildly tachycardic initially and with blood pressure 91 systolic.  EKG features sinus rhythm with PVCs.  Chemistry panel notable for albumin of 2.0.  CBC features a leukocytosis to 54,600 and a normocytic anemia with hemoglobin 8.2.  Covid screening test not yet resulted.  Patient was given a liter of saline in the ED.  ED physician reports discussing the case with oncologist at [facility] who advised admitting the  patient here.  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 (HCC)   Normocytic anemia   Leukocytosis   CLL (chronic lymphocytic leukemia) (HCC)   FTT (failure to thrive) in adult   + extensive acute  DVT (left),  -acute deep vein thrombosis involving the left internal jugular vein, left subclavian vein, left axillary vein, left brachial veins, left radial veins and left ulnar veins - No signs of svc syndrome, case discussed with  Oncology recommend heparin drip then lovenox, hgb slightly dropped but no overt bleeding, if hgb stable at current level, then can transition to lovenox tomorrow -will need lovenox teaching   acute superficial vein thrombosis involving the left basilic vein.  Supportive treatment  Hypokalemia /hypomagnesemia: Remain low continue replace , recheck in the morning  Leukocytosis -Denies recent use of G-CSF - sHe is on steroid  -Possibly reactive, no over sign of infection -she also has h/o of CLL, ldh mildly elevated at 238 -appear start to trend down, Repeat  in the morning Advise to follow with hematology oncology next week  Normocytic anemia -Last chemo on 9/24 -  fobt negative for blood  History of hypertension blood pressure low normal on presentation Home  blood pressure medication lisinopril HCTZ held since admission She does appear dehydrated, she received hydration since admission, will  continue ns at 75cc/hr for another 10hrs, she started to eat more  metastatic squamous cell carcinoma  -She reports to admission MD that she is not sure that she wants to receive chemotherapy again given side-effects from infusion 2 wks ago and plans to discuss with her oncologist  -She said she is given prednisone for this -son request [place] to contact her oncologist at [facility] Dr Johnnye Sima at [phone] on Monday for recommendation, transfer vs home with clinic follow up. -she is improving, she maybe able to discharge home with home health and close outpatient follow up   FTT, Patient currently is declining home health, son request home health, I do think patient will benefit home health PT/RN    DVT prophylaxis: on heparin drip   Code Status:full Family Communication: Son over the phone with his permission Disposition:   Status is: Inpatient   Dispo: The patient is from: Home              Anticipated d/c is to: likely home with home health               Anticipated d/c date is: TBD, if symptom improves, tolerate anticoagulation without bleeding, monitor hemoglobin, need to transition to Lovenox prior to discharge, needs Lovenox teaching               Consultants:   Case discussed with hematology Dr. Irene Limbo over the phone  Procedures:   none  Antimicrobials:    none    Objective: Vitals:   03/13/20 0500 03/13/20 0551 03/13/20 0900 03/13/20 1143  BP:  (!) 133/59 125/65 125/62  Pulse:  82 80 94  Resp:  18 15 20   Temp:  98.6 F (37 C)  98.1 F (36.7 C)  TempSrc:    Oral  SpO2:  93% 100% 95%  Weight: 52.5 kg     Height:        Intake/Output Summary (Last 24 hours) at 03/13/2020 1657 Last data filed at 03/13/2020 1624 Gross per 24 hour  Intake 1357.06 ml  Output 1000 ml  Net 357.06 ml   Filed Weights   03/11/20 1809 03/12/20 0354 03/13/20 0500  Weight: 45.8 kg 50.3 kg 52.5 kg    Examination:  General exam: Appears stronger today, AAOx3 Respiratory  system: Clear to auscultation. Respiratory effort normal. Cardiovascular system: S1 & S2 heard, RRR. No JVD, no murmur, No pedal edema. Gastrointestinal system: Abdomen is nondistended, soft and nontender. Normal bowel sounds heard. Central nervous system: Alert and oriented x3, no focal neurological deficits. Extremities: Left arm edema Skin: No rashes, lesions or ulcers Psychiatry: pleasant, cooperative,    Data Reviewed: I have personally reviewed following labs and imaging studies  CBC: Recent Labs  Lab 03/11/20 1830 03/11/20 2046 03/12/20 0720 03/13/20 0701  WBC QUANTITY NOT SUFFICIENT, UNABLE TO PERFORM TEST 54.6* 40.9* 40.5*  NEUTROABS  --   --  34.8*  --   HGB QUESTIONABLE RESULTS, RECOMMEND RECOLLECT TO VERIFY 8.2* 7.5* 7.4*  HCT QUESTIONABLE RESULTS, RECOMMEND RECOLLECT TO VERIFY 26.7* 24.0* 24.2*  MCV QUESTIONABLE RESULTS, RECOMMEND RECOLLECT TO VERIFY 94.7 96.4 96.8  PLT QUESTIONABLE RESULTS, RECOMMEND RECOLLECT TO VERIFY 291 271 256  Basic Metabolic Panel: Recent Labs  Lab 03/11/20 1830 03/12/20 0720 03/13/20 0701  NA 137 138 138  K 3.6 3.0* 3.4*  CL 95* 98 101  CO2 27 29 26   GLUCOSE 163* 119* 138*  BUN 19 16 14   CREATININE 0.85 0.78 0.75  CALCIUM 8.6* 7.6* 7.6*  MG  --  2.0 1.8    GFR: Estimated Creatinine Clearance: 53.5 mL/min (by C-G formula based on SCr of 0.75 mg/dL).  Liver Function Tests: Recent Labs  Lab 03/11/20 1830  AST 18  ALT 12  ALKPHOS 110  BILITOT 0.4  PROT 5.4*  ALBUMIN 2.0*    CBG: No results for input(s): GLUCAP in the last 168 hours.   Recent Results (from the past 240 hour(s))  Respiratory Panel by RT PCR (Flu A&B, Covid) - Nasopharyngeal Swab     Status: None   Collection Time: 03/11/20 11:36 PM   Specimen: Nasopharyngeal Swab  Result Value Ref Range Status   SARS Coronavirus 2 by RT PCR NEGATIVE NEGATIVE Final    Comment: (NOTE) SARS-CoV-2 target nucleic acids are NOT DETECTED.  The SARS-CoV-2 RNA is  generally detectable in upper respiratoy specimens during the acute phase of infection. The lowest concentration of SARS-CoV-2 viral copies this assay can detect is 131 copies/mL. A negative result does not preclude SARS-Cov-2 infection and should not be used as the sole basis for treatment or other patient management decisions. A negative result may occur with  improper specimen collection/handling, submission of specimen other than nasopharyngeal swab, presence of viral mutation(s) within the areas targeted by this assay, and inadequate number of viral copies (<131 copies/mL). A negative result must be combined with clinical observations, patient history, and epidemiological information. The expected result is Negative.  Fact Sheet for Patients:  PinkCheek.be  Fact Sheet for Healthcare Providers:  GravelBags.it  This test is no t yet approved or cleared by the [place] FDA and  has been authorized for detection and/or diagnosis of SARS-CoV-2 by FDA under an Emergency Use Authorization (EUA). This EUA will remain  in effect (meaning this test can be used) for the duration of the COVID-19 declaration under Section 564(b)(1) of the Act, 21 U.S.C. section 360bbb-3(b)(1), unless the authorization is terminated or revoked sooner.     Influenza A by PCR NEGATIVE NEGATIVE Final   Influenza B by PCR NEGATIVE NEGATIVE Final    Comment: (NOTE) The Xpert Xpress SARS-CoV-2/FLU/RSV assay is intended as an aid in  the diagnosis of influenza from Nasopharyngeal swab specimens and  should not be used as a sole basis for treatment. Nasal washings and  aspirates are unacceptable for Xpert Xpress SARS-CoV-2/FLU/RSV  testing.  Fact Sheet for Patients: PinkCheek.be  Fact Sheet for Healthcare Providers: GravelBags.it  This test is not yet approved or cleared by the [place] FDA and  has been authorized for detection and/or diagnosis of SARS-CoV-2 by  FDA under an Emergency Use Authorization (EUA). This EUA will remain  in effect (meaning this test can be used) for the duration of the  Covid-19 declaration under Section 564(b)(1) of the Act, 21  U.S.C. section 360bbb-3(b)(1), unless the authorization is  terminated or revoked. Performed at [facility], [place] [address], [place], [place] [address]          Radiology Studies: VAS [place] UPPER EXTREMITY VENOUS DUPLEX  Result Date: 03/12/2020 UPPER VENOUS STUDY  Indications: Swelling, and metastatic squamous cell carcinoma with suspected lung primary. Comparison Study: No prior study Performing Technologist:  Michelle Simonetti MHA, RDMS, RVT, RDCS  Examination Guidelines: A complete evaluation includes B-mode imaging, spectral Doppler, color Doppler, and power Doppler as needed of all accessible portions of each vessel. Bilateral testing is considered an integral part of a complete examination. Limited examinations for reoccurring indications may be performed as noted.  Right Findings: +----------+------------+---------+-----------+----------+-------+ RIGHT     CompressiblePhasicitySpontaneousPropertiesSummary +----------+------------+---------+-----------+----------+-------+ Subclavian               Yes       Yes                      +----------+------------+---------+-----------+----------+-------+  Left Findings: +----------+------------+---------+-----------+----------+-------+ LEFT      CompressiblePhasicitySpontaneousPropertiesSummary +----------+------------+---------+-----------+----------+-------+ IJV           None                 No                       +----------+------------+---------+-----------+----------+-------+ Subclavian    None                 No                       +----------+------------+---------+-----------+----------+-------+ Axillary      None                  No                       +----------+------------+---------+-----------+----------+-------+ Brachial      None                 No                       +----------+------------+---------+-----------+----------+-------+ Radial        None                 No                       +----------+------------+---------+-----------+----------+-------+ Ulnar         None                 No                       +----------+------------+---------+-----------+----------+-------+ Cephalic      Full                                          +----------+------------+---------+-----------+----------+-------+ Basilic       None                                          +----------+------------+---------+-----------+----------+-------+  Summary:  Right: No evidence of thrombosis in the subclavian.  Left: Findings consistent with acute deep vein thrombosis involving the left internal jugular vein, left subclavian vein, left axillary vein, left brachial veins, left radial veins and left ulnar veins. Findings consistent with acute superficial vein thrombosis involving the left basilic vein.  *See table(s) above for measurements and observations.  Diagnosing physician: Servando Snare MD Electronically signed by Servando Snare MD on 03/12/2020 at 11:25:09 AM.    Final  Scheduled Meds: . predniSONE  20 mg Oral Q breakfast  . senna-docusate  1 tablet Oral BID   Continuous Infusions: . sodium chloride Stopped (03/13/20 1624)  . heparin 1,450 Units/hr (03/13/20 0846)     LOS: 1 day   Time spent: 35 mins Greater than 50% of this time was spent in counseling, explanation of diagnosis, planning of further management, and coordination of care.  I have personally reviewed and interpreted on  03/13/2020 daily labs, tele strips, imagings as discussed above under date review session and assessment and plans.  I reviewed all nursing notes, pharmacy notes, vitals, pertinent  old records  I have discussed plan of care as described above with RN , patient and family on 03/13/2020  Voice Recognition /Dragon dictation system was used to create this note, attempts have been made to correct errors. Please contact the author with questions and/or clarifications.   Florencia Reasons, MD PhD FACP [facility]  Available via Epic secure chat 7am-7pm for nonurgent issues Please page for urgent issues To page the attending provider between 7A-7P or the covering provider during after hours 7P-7A, please log into the web site [email or web address] and access using universal [place] password for that web site. If you do not have the password, please call the hospital operator.    03/13/2020, 4:57 PM

## 2020-03-13 NOTE — Progress Notes (Signed)
ANTICOAGULATION CONSULT NOTE - Initial Consult  Pharmacy Consult for IV heparin Indication: new VTE   No Known Allergies  Patient Measurements: Height: 5\' 4"  (162.6 cm) Weight: 52.5 kg (115 lb 12.8 oz) IBW/kg (Calculated) : 54.7 Heparin Dosing Weight: 50.3kg  Vital Signs: Temp: 98.6 F (37 C) (10/10 0551) Temp Source: Oral (10/10 0030) BP: 133/59 (10/10 0551) Pulse Rate: 82 (10/10 0551)  Labs: Recent Labs    03/11/20 1830 03/11/20 1830 03/11/20 2046 03/11/20 2046 03/12/20 0720 03/12/20 2041 03/13/20 0701  HGB QUESTIONABLE RESULTS, RECOMMEND RECOLLECT TO VERIFY   < > 8.2*   < > 7.5*  --  7.4*  HCT QUESTIONABLE RESULTS, RECOMMEND RECOLLECT TO VERIFY   < > 26.7*  --  24.0*  --  24.2*  PLT QUESTIONABLE RESULTS, RECOMMEND RECOLLECT TO VERIFY   < > 291  --  271  --  256  HEPARINUNFRC  --   --   --   --   --  <0.10* 0.12*  CREATININE 0.85  --   --   --  0.78  --  0.75   < > = values in this interval not displayed.    Estimated Creatinine Clearance: 53.5 mL/min (by C-G formula based on SCr of 0.75 mg/dL).   Medical History: Past Medical History:  Diagnosis Date  . Borderline hyperlipidemia   . Hypertension   . NSCL Ca Dx'd 08/2019  . Tonsillitis 12/29/2011  . Varicose vein     Medications:  Scheduled:  . predniSONE  20 mg Oral Q breakfast  . senna-docusate  1 tablet Oral BID    Assessment: 69yoF starting IV heparin for new VTE. Patient reports 2-3 days of left arm swelling. [place] consistent with extensive DVTs of left upper extremity. PMH s/f metastatic squamous cell carcinoma followed by [facility] hematology.   F/u heparin level 0.12. Verified with RN that bolus was given and rate confirmed. No problems with infusion running overnight. Hgb low, 7.4 and PLT WNL. No s/sx bleeding noted.  Goal of Therapy:  Heparin level 0.3-0.7 units/ml Monitor platelets by anticoagulation protocol: Yes   Plan:  Heparin bolus 2,000 units x1 and increase infusion to 1,450 units/hr Check  anti-Xa level in 8 hours and daily while on heparin Continue to monitor H&H and platelets  Mercy Riding, PharmD PGY1 Acute Care Pharmacy Resident Please refer to [facility] for unit-specific pharmacist

## 2020-03-13 NOTE — Evaluation (Signed)
Clinical/Bedside Swallow Evaluation Patient Details  Name: Jillian Hartman MRN: [id number] Date of Birth: 1951/01/08  Today's Date: 03/13/2020 Time: SLP Start Time (ACUTE ONLY): 0945 SLP Stop Time (ACUTE ONLY): 1005 SLP Time Calculation (min) (ACUTE ONLY): 20 min  Past Medical History:  Past Medical History:  Diagnosis Date  . Borderline hyperlipidemia   . Hypertension   . NSCL Ca Dx'd 08/2019  . Tonsillitis 12/29/2011  . Varicose vein    Past Surgical History:  Past Surgical History:  Procedure Laterality Date  . ABDOMINAL HYSTERECTOMY    . CERVICAL CONIZATION W/BX     '77 after abnormal pap  . LAPAROSCOPY     '82 fertility work up   HPI:  The pt is a 69 yo female presenting with decreased appetite and weakness following start of a new chemotherapy regiment a few days ago. She is undergoing continued workup for weakness and LUE swelling. PMH includes: metastatic squamous cell carcinoma with suspected lung primary, CLL, and HTN.   Assessment / Plan / Recommendation Clinical Impression  Patient presents with a mild pharyngeal phase dysphagia impacting her swallow of regular solids but without any overt s/s of aspiration, penetration or difficulty with thin liquids. Patient appeared to have to perform a more effortful swallow with regular solids and she does report having loss of appetite and onset of difficulty swallowing following recent cancer treatment. (infusion of first infusion of carboplatin, paclitaxel, and pembrolizumab ) Today she says her appetite seems to be back (breakfast tray in room and she had consumed majority of breakfast sausage, milk and 30% of her pancakes. Patient denies GERD but did state a doctor had put her on Protonix at one point some time ago but she never took it as she didnt seem to need it. Patient may benefit from a follow up from SLP to ensure her swallow function is adequate. SLP Visit Diagnosis: Dysphagia, unspecified (R13.10)    Aspiration Risk   No limitations    Diet Recommendation Regular;Thin liquid   Liquid Administration via: Cup;Straw Medication Administration: Whole meds with liquid Supervision: Patient able to self feed Compensations: Slow rate;Small sips/bites Postural Changes: Seated upright at 90 degrees    Other  Recommendations Oral Care Recommendations: Oral care BID   Follow up Recommendations None      Frequency and Duration min 1 x/week  1 week       Prognosis Prognosis for Safe Diet Advancement: Good      Swallow Study   General Date of Onset: 03/11/20 HPI: The pt is a 69 yo female presenting with decreased appetite and weakness following start of a new chemotherapy regiment a few days ago. She is undergoing continued workup for weakness and LUE swelling. PMH includes: metastatic squamous cell carcinoma with suspected lung primary, CLL, and HTN. Type of Study: Bedside Swallow Evaluation Previous Swallow Assessment: none Diet Prior to this Study: Regular;Thin liquids Temperature Spikes Noted: No Respiratory Status: Room air History of Recent Intubation: No Behavior/Cognition: Alert;Pleasant mood;Cooperative Oral Cavity Assessment: Within Functional Limits Oral Care Completed by SLP: No Oral Cavity - Dentition: Adequate natural dentition Vision: Functional for self-feeding Self-Feeding Abilities: Able to feed self Patient Positioning: Upright in bed Baseline Vocal Quality: Normal Volitional Cough: Strong Volitional Swallow: Able to elicit    Oral/Motor/Sensory Function Overall Oral Motor/Sensory Function: Within functional limits   Ice Chips     Thin Liquid Thin Liquid: Within functional limits Presentation: Straw    Nectar Thick     Honey Thick  Puree Puree: Not tested   Solid     Solid: Impaired Pharyngeal Phase Impairments: Other (comments) Other Comments: Patient appeared to have to swllow more effortfully with regular solids      Sonia Baller, MA, [facility] Pager: [phone]

## 2020-03-13 NOTE — Progress Notes (Signed)
ANTICOAGULATION CONSULT NOTE   Pharmacy Consult for IV heparin Indication: new VTE   No Known Allergies  Patient Measurements: Height: 5\' 4"  (162.6 cm) Weight: 52.5 kg (115 lb 12.8 oz) IBW/kg (Calculated) : 54.7 Heparin Dosing Weight: 50.3kg  Vital Signs: Temp: 98.1 F (36.7 C) (10/10 1143) Temp Source: Oral (10/10 1143) BP: 125/62 (10/10 1143) Pulse Rate: 94 (10/10 1143)  Labs: Recent Labs    03/11/20 1830 03/11/20 1830 03/11/20 2046 03/11/20 2046 03/12/20 0720 03/12/20 2041 03/13/20 0701 03/13/20 1710  HGB QUESTIONABLE RESULTS, RECOMMEND RECOLLECT TO VERIFY   < > 8.2*   < > 7.5*  --  7.4*  --   HCT QUESTIONABLE RESULTS, RECOMMEND RECOLLECT TO VERIFY   < > 26.7*  --  24.0*  --  24.2*  --   PLT QUESTIONABLE RESULTS, RECOMMEND RECOLLECT TO VERIFY   < > 291  --  271  --  256  --   HEPARINUNFRC  --   --   --   --   --  <0.10* 0.12* 0.27*  CREATININE 0.85  --   --   --  0.78  --  0.75  --    < > = values in this interval not displayed.    Estimated Creatinine Clearance: 53.5 mL/min (by C-G formula based on SCr of 0.75 mg/dL).   Medical History: Past Medical History:  Diagnosis Date  . Borderline hyperlipidemia   . Hypertension   . NSCL Ca Dx'd 08/2019  . Tonsillitis 12/29/2011  . Varicose vein     Medications:  Scheduled:  . predniSONE  20 mg Oral Q breakfast  . senna-docusate  1 tablet Oral BID    Assessment: 69yoF starting IV heparin for new VTE. Patient reports 2-3 days of left arm swelling. [place] consistent with extensive DVTs of left upper extremity. PMH s/f metastatic squamous cell carcinoma followed by [facility] hematology.  -heparin level= 0.27 on 1450 units/hr    Goal of Therapy:  Heparin level 0.3-0.7 units/ml Monitor platelets by anticoagulation protocol: Yes   Plan:  Increase heparin to 1550 units/hr Heparin level daily wth CBC daily  Hildred Laser, PharmD Clinical Pharmacist **Pharmacist phone directory can now be found on [email or web address] ([email or web address]).   Listed under [place].

## 2020-03-13 NOTE — Progress Notes (Signed)
PT Cancellation Note  Patient Details Name: Jillian Hartman MRN: [id number] DOB: September 19, 1950   Cancelled Treatment:    Reason Eval/Treat Not Completed: Patient declined, no reason specified (pt politely declining. Reports she just returned to bed recently).  Wyona Almas, PT, DPT Acute Rehabilitation Services Pager [phone] Office [phone]    Deno Etienne 03/13/2020, 3:24 PM

## 2020-03-14 LAB — BASIC METABOLIC PANEL
Anion gap: 10 (ref 5–15)
BUN: 10 mg/dL (ref 8–23)
CO2: 24 mmol/L (ref 22–32)
Calcium: 7.5 mg/dL — ABNORMAL LOW (ref 8.9–10.3)
Chloride: 103 mmol/L (ref 98–111)
Creatinine, Ser: 0.7 mg/dL (ref 0.44–1.00)
GFR, Estimated: >60 mL/min (ref >60)
Glucose, Bld: 138 mg/dL — ABNORMAL HIGH (ref 70–99)
Potassium: 3.5 mmol/L (ref 3.5–5.1)
Sodium: 137 mmol/L (ref 135–145)

## 2020-03-14 LAB — CBC WITH DIFFERENTIAL/PLATELET
Abs Immature Granulocytes: 0 10*3/uL (ref 0.00–0.07)
Basophils Absolute: 0 10*3/uL (ref 0.0–0.1)
Basophils Relative: 0 %
Eosinophils Absolute: 0 10*3/uL (ref 0.0–0.5)
Eosinophils Relative: 0 %
HCT: 23.9 % — ABNORMAL LOW (ref 36.0–46.0)
Hemoglobin: 7.5 g/dL — ABNORMAL LOW (ref 12.0–15.0)
Lymphocytes Relative: 8 %
Lymphs Abs: 3.9 10*3/uL (ref 0.7–4.0)
MCH: 30.4 pg (ref 26.0–34.0)
MCHC: 31.4 g/dL (ref 30.0–36.0)
MCV: 96.8 fL (ref 80.0–100.0)
Monocytes Absolute: 0 10*3/uL — ABNORMAL LOW (ref 0.1–1.0)
Monocytes Relative: 0 %
Neutro Abs: 44.4 10*3/uL — ABNORMAL HIGH (ref 1.7–7.7)
Neutrophils Relative %: 92 %
Platelets: 280 10*3/uL (ref 150–400)
RBC: 2.47 MIL/uL — ABNORMAL LOW (ref 3.87–5.11)
RDW: 16.3 % — ABNORMAL HIGH (ref 11.5–15.5)
WBC: 48.3 10*3/uL — ABNORMAL HIGH (ref 4.0–10.5)
nRBC: 0 % (ref 0.0–0.2)
nRBC: 0 /100 WBC

## 2020-03-14 LAB — HEPARIN LEVEL (UNFRACTIONATED): Heparin Unfractionated: 0.23 IU/mL — ABNORMAL LOW (ref 0.30–0.70)

## 2020-03-14 LAB — MAGNESIUM: Magnesium: 1.8 mg/dL (ref 1.7–2.4)

## 2020-03-14 MED ORDER — ENOXAPARIN SODIUM 60 MG/0.6ML ~~LOC~~ SOLN
50.0000 mg | Freq: Two times a day (BID) | SUBCUTANEOUS | Status: DC
  Administered 2020-03-14 – 2020-03-16 (×5): 50 mg via SUBCUTANEOUS
  Filled 2020-03-14 (×5): qty 0.6

## 2020-03-14 NOTE — Progress Notes (Signed)
Pt asking if MD can update her oncology doctor in [place] before her app on Friday. Paged MD and informed.

## 2020-03-14 NOTE — Progress Notes (Addendum)
ANTICOAGULATION CONSULT NOTE - Follow Up Consult  Pharmacy Consult for Heparin Indication: DVT LUE  No Known Allergies  Patient Measurements: Height: 5\' 4"  (162.6 cm) Weight: 54.7 kg (120 lb 9.6 oz) (scale c) IBW/kg (Calculated) : 54.7 Heparin Dosing Weight: 50.5 kg  Vital Signs: Temp: 98.2 F (36.8 C) (10/11 0836) Temp Source: Oral (10/11 0836) BP: 134/71 (10/11 0836) Pulse Rate: 96 (10/11 0836)  Labs: Recent Labs    03/12/20 0720 03/12/20 2041 03/13/20 0701 03/13/20 1710 03/14/20 0516  HGB 7.5*  --  7.4*  --  7.5*  HCT 24.0*  --  24.2*  --  23.9*  PLT 271  --  256  --  280  HEPARINUNFRC  --    < > 0.12* 0.27* 0.23*  CREATININE 0.78  --  0.75  --  0.70   < > = values in this interval not displayed.    Estimated Creatinine Clearance: 55.7 mL/min (by C-G formula based on SCr of 0.7 mg/dL).  Assessment: 69yoF on IV heparin for new VTE. Patient reports 2-3 days of left arm swelling. [place] consistent with extensive DVTs of left upper extremity. PMH s/f metastatic squamous cell carcinoma followed by [facility] hematology.    Heparin level subtherapeutic (0.23) on 1550 units/hr. Down from 0.27 last night on 1450 units/hr. No known infusion issues.  Hgb low stable, platelet count normal. No bleeding reported.   * Weight 45.8 kg (101 lbs) >>54.7 kg (120 lbs) since admitted. Patient reports usual weight ~111 lbs (50.5 kg).   Goal of Therapy:  Heparin level 0.3-0.7 units/ml Monitor platelets by anticoagulation protocol: Yes   Plan:   Increase heparin drip to 1700 units/hr.  Heparin level ~6 hours after rate change.  Daily heparin level and CBC while on heparin.  Noted plan to transition to Lovenox.  Arty Baumgartner, RPh Phone: [phone] 03/14/2020,9:15 AM   Addendum:   Transitioned to Lovenox 50 mg SQ q12h this afternoon.   Dosed based on her usual weight of ~111 lbs.   We discussed timing > first dose given at 2pm > next dose at midnight tonight, then 10a/10p  starting tomorrow morning. She can adjust administration times after discharge if more convenient for her.   Consuello Masse, RPh 03/14/2020 5:09 PM

## 2020-03-14 NOTE — Progress Notes (Signed)
PROGRESS NOTE    Jillian Hartman  JQB:[id number] DOB: 1951-02-02 DOA: 03/11/2020 PCP: Cassandria Anger, MD    Brief Narrative:  69 year old female with history of metastatic squamous cell carcinoma with suspected lung primary, CLL, hypertension presented to the ER for evaluation of malaise, generalized weakness fatigue and left arm swelling.  Patient has been followed by hematology oncology at [facility], was on oral therapy, received first dose of carboplatin, paclitaxel and pembrolizumab about 2 weeks ago.  Since then she has not been feeling well.  She has very poor appetite and profound fatigue.  Also has discomfort from large neck mass. In the emergency room, she was afebrile.  On room air.  Tachycardic.  Blood pressure 90 systolic.  WBC count 54,000.  Normocytic anemia with hemoglobin 8.2.  COVID-19 negative.  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 Treated with heparin drip, changed to Lovenox.  Hemoglobin remains low, however stable. Start Lovenox teaching, she will be discharged home with Lovenox.  Metastatic squamous cell carcinoma of the lungs with failure to thrive, intolerance to chemotherapy: Patient is followed by oncology at [facility].  Remains debilitated and frail. Interested in talking with palliative care to provide support and coordination. Consult palliative care. Currently no indication for hospital to hospital transfer Will discharge patient home with follow-up with her oncologist on Friday. Encourage nutrition, high calorie and protein intake.  History of hypertension: Blood pressures  low on presentation.  She has been without any antihypertensive medications and blood pressures stable.  Patient with weight loss, frailty and probably not needing any continues blood pressure medications.  Leukocytosis with history of CLL: Follow-up with oncology.  Patient on maintenance prednisone that she will continue.  Hypokalemia/hypomagnesemia: Replaced and adequate.   DVT prophylaxis: Lovenox   Code Status: Full code Family Communication: Son on the phone Disposition Plan: Status is: Inpatient  Remains inpatient appropriate because:Inpatient level of care appropriate due to severity of illness   Dispo: The patient is from: Home              Anticipated d/c is to: Home with home health therapies              Anticipated d/c date is: 2 days              Patient currently is not medically stable to d/c.         Consultants:   None  Procedures:   None  Antimicrobials:   None   Subjective: Patient seen and examined.  No overnight events.  He still feels very weak.  She is so disappointed with side effects of chemotherapy.  She was not sure whether she will be taking her injections. Feels somehow stronger than before but she was not sure whether she can walk independent.  Objective: Vitals:   03/13/20 2133 03/14/20 0526 03/14/20 0836 03/14/20 1242  BP: 140/65 132/66 134/71 136/71  Pulse: 80 98 96 96  Resp: 18 20 15 17   Temp: 98.3 F (36.8 C) 98.9 F (37.2 C) 98.2 F (36.8 C) 97.9 F (36.6 C)  TempSrc: Oral Oral Oral  SpO2: 95% 90% 93% 93%  Weight:  54.7 kg    Height:        Intake/Output Summary (Last 24 hours) at 03/14/2020 1418 Last data filed at 03/14/2020 1346 Gross per 24 hour  Intake 1305.72 ml  Output 1300 ml  Net 5.72 ml   Filed Weights   03/12/20 0354 03/13/20 0500 03/14/20 0526  Weight: 50.3 kg 52.5 kg 54.7 kg    Examination:  General exam: Appears calm and comfortable, chronically sick looking, not in any distress.  Frail  looking old lady. Respiratory system: Clear to auscultation. Respiratory effort normal.  No added sounds. Cardiovascular system: S1 & S2 heard, RRR. No JVD, murmurs, rubs, gallops or clicks.  Nonpitting edema whole left upper extremity, distal neurovascular status intact. Gastrointestinal system: Abdomen is nondistended, soft and nontender. No organomegaly or masses felt. Normal bowel sounds heard. Central nervous system: Alert and oriented. No focal neurological deficits.  Generalized weakness. Extremities: Symmetric 5 x 5 power. Skin: No rashes, lesions or ulcers Psychiatry: Judgement and insight appear normal. Mood & affect flat.    Data Reviewed: I have personally reviewed following labs and imaging studies  CBC: Recent Labs  Lab 03/11/20 1830 03/11/20 2046 03/12/20 0720 03/13/20 0701 03/14/20 0516  WBC QUANTITY NOT SUFFICIENT, UNABLE TO PERFORM TEST 54.6* 40.9* 40.5* 48.3*  NEUTROABS  --   --  34.8*  --  44.4*  HGB QUESTIONABLE RESULTS, RECOMMEND RECOLLECT TO VERIFY 8.2* 7.5* 7.4* 7.5*  HCT QUESTIONABLE RESULTS, RECOMMEND RECOLLECT TO VERIFY 26.7* 24.0* 24.2* 23.9*  MCV QUESTIONABLE RESULTS, RECOMMEND RECOLLECT TO VERIFY 94.7 96.4 96.8 96.8  PLT QUESTIONABLE RESULTS, RECOMMEND RECOLLECT TO VERIFY 291 271 256 350   Basic Metabolic Panel: Recent Labs  Lab 03/11/20 1830 03/12/20 0720 03/13/20 0701 03/14/20 0516  NA 137 138 138 137  K 3.6 3.0* 3.4* 3.5  CL 95* 98 101 103  CO2 27 29 26 24   GLUCOSE 163* 119* 138* 138*  BUN 19 16 14 10   CREATININE 0.85 0.78 0.75 0.70  CALCIUM 8.6* 7.6* 7.6* 7.5*  MG  --  2.0 1.8 1.8   GFR: Estimated Creatinine Clearance: 55.7 mL/min (by C-G formula based on SCr of 0.7 mg/dL). Liver Function Tests: Recent Labs  Lab 03/11/20 1830  AST 18  ALT 12  ALKPHOS 110  BILITOT 0.4  PROT 5.4*  ALBUMIN 2.0*   No results for input(s): LIPASE, AMYLASE in the last 168 hours. No results for input(s): AMMONIA in the last 168 hours. Coagulation  Profile: No results for input(s): INR, PROTIME in the last 168 hours. Cardiac Enzymes: No results for input(s): CKTOTAL, CKMB, CKMBINDEX, TROPONINI in the last 168 hours. BNP (last 3 results) No results for input(s): PROBNP in the last 8760 hours. HbA1C: No results for input(s): HGBA1C in the last 72 hours. CBG: No results for input(s): GLUCAP in the last 168 hours. Lipid Profile: No results for input(s): CHOL, HDL, LDLCALC, TRIG, CHOLHDL, LDLDIRECT in the last 72 hours. Thyroid Function Tests: No results for input(s): TSH, T4TOTAL, FREET4, T3FREE, THYROIDAB in the last 72 hours. Anemia Panel: No results for input(s): VITAMINB12, FOLATE, FERRITIN, TIBC, IRON, RETICCTPCT in the last 72 hours. Sepsis Labs: No results for input(s): PROCALCITON, LATICACIDVEN in the last 168 hours.  Recent Results (from the past 240 hour(s))  Respiratory Panel by RT PCR (Flu A&B, Covid) - Nasopharyngeal Swab     Status: None   Collection Time: 03/11/20 11:36 PM   Specimen: Nasopharyngeal Swab  Result Value Ref Range  Status   SARS Coronavirus 2 by RT PCR NEGATIVE NEGATIVE Final    Comment: (NOTE) SARS-CoV-2 target nucleic acids are NOT DETECTED.  The SARS-CoV-2 RNA is generally detectable in upper respiratoy specimens during the acute phase of infection. The lowest concentration of SARS-CoV-2 viral copies this assay can detect is 131 copies/mL. A negative result does not preclude SARS-Cov-2 infection and should not be used as the sole basis for treatment or other patient management decisions. A negative result may occur with  improper specimen collection/handling, submission of specimen other than nasopharyngeal swab, presence of viral mutation(s) within the areas targeted by this assay, and inadequate number of viral copies (<131 copies/mL). A negative result must be combined with clinical observations, patient history, and epidemiological information. The expected result is Negative.  Fact Sheet  for Patients:  PinkCheek.be  Fact Sheet for Healthcare Providers:  GravelBags.it  This test is no t yet approved or cleared by the [place] FDA and  has been authorized for detection and/or diagnosis of SARS-CoV-2 by FDA under an Emergency Use Authorization (EUA). This EUA will remain  in effect (meaning this test can be used) for the duration of the COVID-19 declaration under Section 564(b)(1) of the Act, 21 U.S.C. section 360bbb-3(b)(1), unless the authorization is terminated or revoked sooner.     Influenza A by PCR NEGATIVE NEGATIVE Final   Influenza B by PCR NEGATIVE NEGATIVE Final    Comment: (NOTE) The Xpert Xpress SARS-CoV-2/FLU/RSV assay is intended as an aid in  the diagnosis of influenza from Nasopharyngeal swab specimens and  should not be used as a sole basis for treatment. Nasal washings and  aspirates are unacceptable for Xpert Xpress SARS-CoV-2/FLU/RSV  testing.  Fact Sheet for Patients: PinkCheek.be  Fact Sheet for Healthcare Providers: GravelBags.it  This test is not yet approved or cleared by the [place] FDA and  has been authorized for detection and/or diagnosis of SARS-CoV-2 by  FDA under an Emergency Use Authorization (EUA). This EUA will remain  in effect (meaning this test can be used) for the duration of the  Covid-19 declaration under Section 564(b)(1) of the Act, 21  U.S.C. section 360bbb-3(b)(1), unless the authorization is  terminated or revoked. Performed at [facility], [place] [address] [place], [place] [address]          Radiology Studies: No results found.      Scheduled Meds:  enoxaparin (LOVENOX) injection  50 mg Subcutaneous Q12H   predniSONE  20 mg Oral Q breakfast   senna-docusate  1 tablet Oral BID   Continuous Infusions:   LOS: 2 days    Time spent: 35 minutes    Barb Merino,  MD [facility] Pager [phone]

## 2020-03-14 NOTE — Progress Notes (Signed)
Started Lovenox education with the patient.

## 2020-03-14 NOTE — Progress Notes (Signed)
Physical Therapy Treatment Patient Details Name: Jillian Hartman MRN: [id number] DOB: [birth year] Today's Date: 03/14/2020    History of Present Illness The pt is a 69 yo female presenting with decreased appetite and weakness following start of a new chemotherapy regiment a few days ago. She is undergoing continued workup for weakness and LUE swelling. PMH includes: metastatic squamous cell carcinoma with suspected lung primary, CLL, and HTN.     PT Comments    Pt up in chair on entry, agreeable to ambulate with therapy. Pt limited in safe mobility by increased work of breathing with activity and decreased strength and dynamic balance. Pt is mod I for bed mobility, min A for transfers from low position, and min A for ambulation with fatigue. D/c plans remain appropriate. PT will continue to follow acutely.     Follow Up Recommendations  Home health PT;Supervision for mobility/OOB     Equipment Recommendations  Rolling walker with 5" wheels (tub bench)       Precautions / Restrictions Precautions Precautions: Fall Restrictions Weight Bearing Restrictions: No    Mobility  Bed Mobility Overal bed mobility: Modified Independent             General bed mobility comments: increased time and some use of bed rails, but no assist needed  Transfers Overall transfer level: Needs assistance Equipment used: Rolling walker (2 wheeled);1 person hand held assist Transfers: Sit to/from Stand Sit to Stand: Min guard;Min assist         General transfer comment: min guard without AD from recliner, minA and use of grab bar in bathroom to power up from low toilet  Ambulation/Gait Ambulation/Gait assistance: Min guard;Min assist;Supervision Gait Distance (Feet): 30 Feet (1x30, 1x20, 2x15) Assistive device: 1 person hand held assist;None Gait Pattern/deviations: Step-through pattern;Decreased stride length Gait velocity: decreased   General Gait Details: inital ambulation to and rom  bathroom supervision, next bout from bed to door and back min guard, requires min A for steadying on last bout of ambulation          Balance Overall balance assessment: Mild deficits observed, not formally tested                                          Cognition Arousal/Alertness: Awake/alert Behavior During Therapy: WFL for tasks assessed/performed Overall Cognitive Status: Within Functional Limits for tasks assessed                                           General Comments General comments (skin integrity, edema, etc.): pt with 4/4 DoE with short bouts of ambulation however SaO2 remained >93%O2 throughout session       Pertinent Vitals/Pain Pain Assessment: No/denies pain           PT Goals (current goals can now be found in the care plan section) Acute Rehab PT Goals Patient Stated Goal: improve strength PT Goal Formulation: With patient Time For Goal Achievement: 03/26/20 Potential to Achieve Goals: Fair Progress towards PT goals: Progressing toward goals    Frequency    Min 3X/week      PT Plan Current plan remains appropriate (will likely refuse HHPT)       AM-PAC PT "6 Clicks" Mobility   Outcome Measure  Help needed turning from your  back to your side while in a flat bed without using bedrails?: None Help needed moving from lying on your back to sitting on the side of a flat bed without using bedrails?: None Help needed moving to and from a bed to a chair (including a wheelchair)?: None Help needed standing up from a chair using your arms (e.g., wheelchair or bedside chair)?: None Help needed to walk in hospital room?: A Little Help needed climbing 3-5 steps with a railing? : A Lot 6 Click Score: 21    End of Session Equipment Utilized During Treatment: Gait belt Activity Tolerance: Patient tolerated treatment well Patient left: in chair;with call bell/phone within reach Nurse Communication: Mobility status PT  Visit Diagnosis: Unsteadiness on feet (R26.81);Muscle weakness (generalized) (M62.81);Difficulty in walking, not elsewhere classified (R26.2)     Time: 1534-1600 PT Time Calculation (min) (ACUTE ONLY): 26 min  Charges:  $Gait Training: 8-22 mins $Therapeutic Activity: 8-22 mins                     Sofia Vanmeter B. Migdalia Dk PT, DPT Acute Rehabilitation Services Pager [phone] Office [phone]    [place] 03/14/2020, 4:48 PM

## 2020-03-14 NOTE — Progress Notes (Signed)
  Speech Language Pathology Treatment: Dysphagia  Patient Details Name: Jillian Hartman MRN: [id number] DOB: 1950-07-07 Today's Date: 03/14/2020 Time: 8299-3716 SLP Time Calculation (min) (ACUTE ONLY): 10 min  Assessment / Plan / Recommendation Clinical Impression  Pt was seen for dysphagia treatment and was cooperative throughout the session. Jasmina, RN indicated that the pt has been tolerating the current diet without overt s/sx of aspiration. Pt also denied any difficulty with p.o. intake with the exception of her coughing once with coffee this morning. Pt tolerated puree solids and regular texture solids without symptoms of oropharyngeal dysphagia. A single cough was noted after the initial bolus of thin liquids via straw; however, subsequent boluses of individual and consecutive swallows of thin liquids were tolerated without overt s/sx of aspiration. The possibility of conducting a modified barium swallow study was discussed. However, pt indicated that she would like to monitor her symptoms and defer it until she speaks with her oncologist. Considering the infrequency of the pt's symptoms, pt was educated regarding signs of aspiration and the need to advise her medical team of any increase in symptoms. She verbalized understanding and agreement. SLP is in agreement in plan to defer an instrumental assessment and continue with the current diet of regular texture solids and thin liquids. Further skilled SLP services are not clinically indicated at this time.    HPI HPI: The pt is a 69 yo female presenting with decreased appetite and weakness following start of a new chemotherapy regiment a few days ago. She is undergoing continued workup for weakness and LUE swelling. PMH includes: metastatic squamous cell carcinoma with suspected lung primary, CLL, and HTN.      SLP Plan  All goals met;Discharge SLP treatment due to (comment)       Recommendations  Diet recommendations: Regular;Thin  liquid Liquids provided via: Cup;Straw Medication Administration: Whole meds with liquid Supervision: Patient able to self feed Compensations: Slow rate;Small sips/bites Postural Changes and/or Swallow Maneuvers: Seated upright 90 degrees                Oral Care Recommendations: Oral care BID Follow up Recommendations: None SLP Visit Diagnosis: Dysphagia, unspecified (R13.10) Plan: All goals met;Discharge SLP treatment due to (comment)       Tamee Battin I. Hardin Negus, [place], [place] Office number [phone] Pager [place] 03/14/2020, 10:44 AM

## 2020-03-14 NOTE — Progress Notes (Signed)
Informed CM about HH and rolling walker needs.  Spoke with son [name], he requested to talk to the MD, paged MD Ghimire.

## 2020-03-15 DIAGNOSIS — C801 Malignant (primary) neoplasm, unspecified: Secondary | ICD-10-CM

## 2020-03-15 DIAGNOSIS — Z515 Encounter for palliative care: Secondary | ICD-10-CM

## 2020-03-15 DIAGNOSIS — R627 Adult failure to thrive: Secondary | ICD-10-CM

## 2020-03-15 DIAGNOSIS — C9110 Chronic lymphocytic leukemia of B-cell type not having achieved remission: Secondary | ICD-10-CM

## 2020-03-15 DIAGNOSIS — Z7189 Other specified counseling: Secondary | ICD-10-CM

## 2020-03-15 DIAGNOSIS — R531 Weakness: Secondary | ICD-10-CM

## 2020-03-15 LAB — CBC
HCT: 23.4 % — ABNORMAL LOW (ref 36.0–46.0)
Hemoglobin: 7.4 g/dL — ABNORMAL LOW (ref 12.0–15.0)
MCH: 30.3 pg (ref 26.0–34.0)
MCHC: 31.6 g/dL (ref 30.0–36.0)
MCV: 95.9 fL (ref 80.0–100.0)
Platelets: 267 10*3/uL (ref 150–400)
RBC: 2.44 MIL/uL — ABNORMAL LOW (ref 3.87–5.11)
RDW: 16.6 % — ABNORMAL HIGH (ref 11.5–15.5)
WBC: 43.2 10*3/uL — ABNORMAL HIGH (ref 4.0–10.5)
nRBC: 0 % (ref 0.0–0.2)

## 2020-03-15 MED ORDER — ENOXAPARIN (LOVENOX) PATIENT EDUCATION KIT
PACK | Freq: Once | Status: AC
  Filled 2020-03-15: qty 1

## 2020-03-15 MED ORDER — ENOXAPARIN SODIUM 60 MG/0.6ML ~~LOC~~ SOLN: 50.0000 mg | Freq: Two times a day (BID) | SUBCUTANEOUS | 0 refills | Status: AC

## 2020-03-15 NOTE — Consult Note (Signed)
Consultation Note Date: 03/15/2020   Patient Name: Jillian Hartman  DOB: June 28, 1950  MRN: [id number]  Age / Sex: 69 y.o., female  PCP: Plotnikov, [name], MD Referring Physician: Barb Merino, MD  Reason for Consultation: Establishing goals of care  HPI/Patient Profile: 69 y.o. female  with past medical history of metastatic squamous cell carcinoma with suspected lung primary, CLL, hypertension admitted on 03/11/2020 with weakness/fatigue and left arm swelling. Followed by [facility] receiving oral chemotherapy and s/p first dose of carboplatin, paclitaxel, and pembrolizumab approximately 2 weeks ago. Discomfort from large neck mass. Found to have extensive acute left upper extremity DVT on heparin gtt now transitioned to lovenox injections. Palliative medicine consultation for goals of care.   Clinical Assessment and Goals of Care:  I have reviewed medical records, discussed with care team, and met with Jillian Hartman at bedside to discuss goals of care. [name] is awake, alert, oriented and able to participate in discussion. No family at bedside. No current pain or discomfort.   I introduced Palliative Medicine as specialized medical care for people living with serious illness. It focuses on providing relief from the symptoms and stress of a serious illness. The goal is to improve quality of life for both the patient and the family.  We discussed a brief life review. Divorced. One son (Jillian Hartman) who lives with her. They have been very close since the death of her daughter in the late '90's. Patient works in Personal assistant.   Discussed her journey since cancer diagnoses early March 2021. [name] reports doing well on oral chemotherapy. [place], independent and feeling well, two days before her initial IV chemotherapy. Since IV chemo, she has greatly declined functionally. 'Wiped me out.'   Discussed events  leading up to admission and course of hospitalization including diagnoses, interventions, plan of care.   I attempted to elicit values and goals of care important to the patient. [name] shares it has been one thing after the next since cancer diagnoses. She is greatly concerned about her f/u oncology appointment this Friday at [facility] for 2nd round of chemotherapy. She is very reluctant to move forward with IV chemotherapy and is very worried about burdening her son with the drive to [facility] and then the care he will have to provide her after 5 hour chemo treatment.  [name] understands her cancer is incurable. She shares she has been preparing and getting her affairs in order. Quality of life aspects of care are becoming more important to her as time goes on. We discussed quality verus quantity of life with her terminal cancers. She does not wish to die at home and shares that her Jillian Hartman was home with hospice and then transitioned into hospice home when he neared EOL. This is what she would desire when she nears EOL.    Discussed in detail outpatient home health with palliative versus outpatient home hospice option and eventual transition to hospice facility when appropriate. Educated on hospice philosophy with goals aimed at comfort, symptom management,  quality of life, allowing nature to take course, and forgoing further aggressive oncology interventions or hospitalizations. [name] is consider comfort focused care plan in the near future but would like to further discuss with her oncologist, [name] if oral chemotherapy is still an option. She plans to ask [name] about recommendation for hospice too.   **Call placed to [facility] triage line to update them on my conversation with patient. Maybe they could have tele visit with her and son prior to Friday, as patient will likely not wish to proceed with second round of IV chemotherapy.   Advanced directives, concepts specific to code status,  artifical feeding and hydration, and rehospitalization were considered and discussed. [name] has a documented living will with son, Jillian Hartman at [facility]. Discussed recommendation for DNR/DNI code status with terminal, incurable cancer. Pragya does not wish to be resuscitated or placed on life support, understanding this would just prolong her suffering. Introduced and discussed MOST form. Mylei plans to discuss with her son before making any changes to code status. Reassured patient that she can complete MOST form with outpatient palliative provider.   Plan is likely discharge today with home health services. Spoke with Lenna Sciara, [facility] palliative liaison and placed outpatient palliative referral. Patient understands this can transition to hospice services at any time.   Questions and concerns were addressed. PMT contact information given.   **This afternoon, spoke with [name] Triage RN and updated her on my conversation with [name] regarding reluctance with further IV chemo, if PO chemo is an option, and/or if [name] recommends hospice moving forward with patient's goals shifting to quality of life/comfort approach. RN will discuss with [name] and plans to schedule a tele visit in the next few days to discuss goals of care.     SUMMARY OF RECOMMENDATIONS    Great initial palliative discussion with patient.   Patient very reluctant to receive IV chemo again on Friday. She would be willing to continue oral chemotherapy if recommended by her oncologist. She is greatly considering quality of life/comfort approach to care moving forward. This NP spoke with [name] Triage RN who will arrange outpatient tele f/u with [name] prior to Friday's infusion appointment.   Plan is home with home health. Patient agreeable with outpatient palliative f/u understanding this can transition to hospice services in the near future.   Introduced MOST form. Patient considering DNR/DNI code status  but plans to further discuss with her son before making any changes.   She has a documented living will with son, Jillian Hartman as HCPOA.   Likely discharge today. Discussed in detail with Dr. Sloan Leiter.    Code Status/Advance Care Planning:  Full code:patient considering DNR/DNI but plans for further discuss with her son first.   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 The following aspects are pertinent.  Past Medical History:  Diagnosis Date   Borderline hyperlipidemia    Hypertension    NSCL Ca Dx'd 08/2019   Tonsillitis 12/29/2011   Varicose vein    Social History   Socioeconomic History   Marital status: Divorced    Spouse name: Not on file   Number of children: 2   Years of education: 16   Highest education level: Not on file  Occupational History   Occupation: real estate Press photographer  Tobacco Use   Smoking status: Never Smoker   Smokeless tobacco: Never Used  Substance and Sexual Activity   Alcohol use: Yes    Comment: occasional/rare   Drug use: Never   Sexual activity: Not on file  Other Topics Concern   Not on file  Social History Narrative   Nurse, 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 Married 1968- divorced 1992   1 son 40- 1  daughter died in MVA 9th grade. Monogamous relationship since 1998. 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            Social Determinants of Health   Financial Resource Strain:    Difficulty of Paying Living Expenses: Not on file  Food Insecurity:    Worried About Charity fundraiser in the Last Year: Not on file   [employer] of Food in the Last Year: Not on file  Transportation Needs:    Lack of Transportation (Medical): Not on file   Lack of Transportation (Non-Medical): Not on file  Physical Activity:    Days of Exercise per Week: Not on file   Minutes of Exercise per Session: Not on file  Stress:    Feeling of Stress : Not on file  Social Connections:    Frequency of Communication with Friends and Family: Not on file   Frequency of Social Gatherings with Friends and Family: Not on file   Attends Religious Services: Not on file   Active Member of [place] or Organizations: Not on file   Attends Archivist Meetings: Not on file   Marital Status: Not on file   Family History  Problem Relation Age of Onset   Hypertension Mother    Hypertension Father    Ulcers Father    Cancer Father        oral cancer   Scheduled Meds:  enoxaparin (LOVENOX) injection  50 mg Subcutaneous Q12H   predniSONE  20 mg Oral Q breakfast   senna-docusate  1 tablet Oral BID   Continuous Infusions: PRN Meds:.acetaminophen **OR** acetaminophen, ondansetron **OR** ondansetron (ZOFRAN) IV, polyethylene glycol Medications Prior to Admission:  Prior to Admission medications   Medication Sig Start Date End Date Taking? Authorizing Provider  Calcium Carb-Cholecalciferol (CALCIUM 600/VITAMIN D3) 600-800 MG-UNIT TABS Take 1 tablet by mouth daily.   Yes [provider]  diltiazem (CARDIZEM CD) 120 MG 24 hr capsule TAKE 1 CAPSULE BY MOUTH EVERY DAY 06/27/19  Yes Plotnikov, [name], MD  hydrochlorothiazide (HYDRODIURIL) 25 MG tablet TAKE 1 TABLET BY MOUTH EVERY  DAY Patient taking differently: Take 25 mg by mouth daily.  04/06/19  Yes Plotnikov, [name], MD  KLOR-CON M10 10 MEQ tablet TAKE 1 TABLET (10 MEQ TOTAL) BY MOUTH 2 (TWO) TIMES DAILY. Patient taking differently: Take 10 mEq by mouth 2 (two) times daily.  04/06/19  Yes Plotnikov, [name], MD  lisinopril (ZESTRIL) 20 MG tablet Take 20 mg by mouth daily. 08/17/19  Yes [provider]  MULTIPLE VITAMIN PO Take 1 tablet by mouth daily.  Yes [provider]  Multiple Vitamins-Minerals (CENTRUM ADULTS) TABS Take 1 tablet by mouth daily.   Yes [provider]  olmesartan (BENICAR) 20 MG tablet Take 1 tablet (20 mg total) by mouth daily. 07/15/19 07/14/20 Yes Plotnikov, [name], MD  predniSONE (DELTASONE) 20 MG tablet Take 20 mg by mouth daily with breakfast.  01/28/20  Yes [provider]  dabrafenib mesylate (TAFINLAR) 75 MG capsule Take 150 mg by mouth 2 (two) times daily. Take on an empty stomach 1 hour before or 2 hours after meals.    [provider]  ondansetron (ZOFRAN) 8 MG tablet Take 1 tablet (8 mg total) by mouth every 8 (eight) hours as needed for nausea or vomiting. Patient not taking: Reported on 03/11/2020 10/14/19   Tish Men, MD  promethazine-codeine [facility] WITH CODEINE) 6.25-10 MG/5ML syrup Take 5 mLs by mouth every 4 (four) hours as needed. Patient not taking: Reported on 03/11/2020 07/15/19   Plotnikov, [name], MD  trametinib dimethyl sulfoxide (MEKINIST) 2 MG tablet Take 2 mg by mouth daily. Take 1 hour before or 2 hours after a meal. Store refrigerated in original container.    [provider]  triamcinolone lotion (KENALOG) 0.1 % Apply 1 application topically 3 (three) times daily. Patient not taking: Reported on 03/11/2020 11/25/19   Sandi Mealy E., PA-C   No Known Allergies Review of Systems  Constitutional: Positive for activity change.   Physical Exam Vitals and nursing note reviewed.  Constitutional:      General:  She is awake.  Pulmonary:     Effort: No tachypnea, accessory muscle usage or respiratory distress.  Skin:    General: Skin is warm and dry.  Neurological:     Mental Status: She is alert and oriented to person, place, and time.  Psychiatric:        Mood and Affect: Mood normal.        Speech: Speech normal.        Behavior: Behavior normal.        Cognition and Memory: Cognition normal.     Vital Signs: BP 137/76 (BP Location: Right Arm)    Pulse 93    Temp 98.3 F (36.8 C) (Oral)    Resp 18    Ht 5' 4"  (1.626 m)    Wt 55.2 kg    SpO2 91%    BMI 20.89 kg/m  Pain Scale: 0-10   Pain Score: 0-No pain   SpO2: SpO2: 91 % O2 Device:SpO2: 91 % O2 Flow Rate: .   IO: Intake/output summary:   Intake/Output Summary (Last 24 hours) at 03/15/2020 1358 Last data filed at 03/15/2020 0800 Gross per 24 hour  Intake 480 ml  Output 0 ml  Net 480 ml    LBM: Last BM Date: 03/14/20 Baseline Weight: Weight: 45.8 kg Most recent weight: Weight: 55.2 kg     Palliative Assessment/Data: PPS 50%   Flowsheet Rows     Most Recent Value  Intake Tab  Referral Department Hospitalist  Unit at Time of Referral Cardiac/Telemetry Unit  Palliative Care Primary Diagnosis Cancer  Palliative Care Type New Palliative care  Reason for referral Clarify Goals of Care  Date first seen by Palliative Care 03/15/20  Clinical Assessment  Palliative Performance Scale Score 50%  Psychosocial & Spiritual Assessment  Palliative Care Outcomes  Patient/Family meeting held? Yes  Who was at the meeting? patient  Palliative Care Outcomes Clarified goals of care, Counseled regarding hospice, Provided end of life care  assistance, Provided psychosocial or spiritual support, ACP counseling assistance, Linked to palliative care logitudinal support      Time In: 0900 Time Out: 1140 Time Total: 110mn Greater than 50%  of this time was spent counseling and coordinating care related to the above assessment and  plan.  Signed by:  MIhor Dow DNP, FNP-C Palliative Medicine Team  Phone: [phone]: [phone]  Please contact Palliative Medicine Team phone at [phone] questions and concerns.  For individual provider: See AShea Evans

## 2020-03-15 NOTE — Progress Notes (Signed)
HOSPITAL MEDICINE OVERNIGHT EVENT NOTE    Notified notified by nursing that patient is complaining of new onset right upper extremity swelling as of this evening.  Chart reviewed, patient already possesses known history of metastatic squamous cell carcinoma of the neck with new diagnosis of extensive left upper extremity DVT, currently on Lovenox.  While Lovenox treatment failure is extremely unlikely and rare, due to patient's concern about the new onset of swelling will obtain ultrasound of the right upper extremity swelling.  If it is indeed positive for a new DVT I would be more inclined to believe that this DVT has been there the entire time and is just now resulting in swelling.  Other possibilities are dependent edema in the setting of severe hypoalbuminemia or edema could be related to the presence of a right upper extremity IV.  Vernelle Emerald  MD [facility]

## 2020-03-15 NOTE — TOC Transition Note (Signed)
Transition of Care [facility]) - CM/SW Discharge Note   Patient Details  Name: Jillian Hartman MRN: [id number] Date of Birth: 06-06-1950  Transition of Care [facility]) CM/SW Contact:  Zenon Mayo, RN Phone Number: 03/15/2020, 10:14 AM   Clinical Narrative:    Patient for possible dc today, NCM offered choice for HHPT, she states [facility] will be ok, NCM made referral to [facility] Mood she is able to take referral and states patient will have a copay with the commercial insurance.  She also will need a rolling walker, NCM made referral to [facility] with [facility] , this will be brought up to the room prior to dc.   Final next level of care: [place] Barriers to Discharge: No Barriers Identified   Patient Goals and CMS Choice Patient states their goals for this hospitalization and ongoing recovery are:: get better CMS Medicare.gov Compare Post Acute Care list provided to:: Patient Choice offered to / list presented to : Patient  Discharge Placement                       Discharge Plan and Services                DME Arranged: Walker rolling DME Agency: [facility] Date DME Agency Contacted: 03/15/20 Time DME Agency Contacted: [phone] Representative spoke with at DME Agency: [place]: PT [place]: [place] Date [place]: 03/15/20 Time [place]: 1014 Representative spoke with at [place]: [place] Determinants of Health ([place]) Interventions     Readmission Risk Interventions No flowsheet data found.

## 2020-03-15 NOTE — Discharge Summary (Signed)
Physician Discharge Summary  Jillian Hartman HEN:[id number] DOB: May 03, 1951 DOA: 03/11/2020  PCP: Cassandria Anger, MD  Admit date: 03/11/2020 Discharge date: 03/15/2020  Admitted From: Home Disposition: Home with home health  Recommendations for Outpatient Follow-up:  1. Follow up with PCP in 1-2 weeks 2. Follow-up with your oncologist as a scheduled this week  Home Health: PT Equipment/Devices: Rolling walker  Discharge Condition: Fair CODE STATUS: Full code Diet recommendation: Regular diet  Discharge summary: 69 year old female with history of CLL, metastatic squamous cell carcinoma of the neck suspected from lung, hypertension presented to the ER for evaluation of generalized weakness fatigue and left arm swelling after receiving first dose of chemotherapy with carboplatin, paclitaxel and pembrolizumab about 2 weeks ago.  She had not been feeling well since then.  Had poor appetite and profound fatigue.  Also has discomfort from large neck mass.  In the emergency room afebrile, on room air.  Tachycardic.  Blood pressure is 90 systolic.  WBC count 54,000.  Hemoglobin 8.2.  COVID-19 negative.  Duplexes showed extensive DVT on all veins of the left upper extremities.  Patient was admitted to the hospital, she was treated with IV fluid, her blood pressure medications were stopped, electrolytes were replaced.  Initially treated with heparin infusion and now changed to Lovenox.  Potassium and magnesium were replaced.  #1. extensive acute DVT left upper extremity in a patient with metastatic squamous cell carcinoma, no signs of complications, no signs of SVC syndrome.  Tumor induced hypercoagulopathy and DVT, will treat with Lovenox.  Patient has received instructions and large the injection techniques.  #2. metastatic squamous cell carcinoma of the lungs, failure to thrive, intolerance to chemotherapy: Stabilized now.  Followed by oncology at [facility].  Remains debilitated and  frail. Desire to discuss with palliative care, seen in the hospital, will have follow-up at home by palliative care team. Patient has a scheduled appointment with her oncologist this week, I called and updated their office about patient's current condition.  #3. hypertension: Blood pressures low, low normal.  Given her frailty she would not need multiple antihypertensive medications.  Will resume low-dose of hydrochlorothiazide along with potassium supplements that she was taking.  Patient with profound deconditioning, she will benefit with therapies at home.  Will prescribe home health physical therapy.  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   Increase activity slowly   Complete by: As directed      Allergies as of 03/15/2020   No Known Allergies     Medication List    STOP taking these medications   dabrafenib mesylate 75 MG capsule Commonly known as: TAFINLAR   diltiazem 120 MG 24 hr capsule Commonly known as: CARDIZEM  CD   lisinopril 20 MG tablet Commonly known as: ZESTRIL   olmesartan 20 MG tablet Commonly known as: Benicar   promethazine-codeine 6.25-10 MG/5ML syrup Commonly known as: PHENERGAN with CODEINE   trametinib dimethyl sulfoxide 2 MG tablet Commonly known as:  MEKINIST   triamcinolone lotion 0.1 % Commonly known as: KENALOG     TAKE these medications   Calcium 600/Vitamin D3 600-800 MG-UNIT Tabs Generic drug: Calcium Carb-Cholecalciferol Take 1 tablet by mouth daily.   Centrum Adults Tabs Take 1 tablet by mouth daily.   enoxaparin 60 MG/0.6ML injection Commonly known as: LOVENOX Inject 0.5 mLs (50 mg total) into the skin every 12 (twelve) hours.   hydrochlorothiazide 25 MG tablet Commonly known as: HYDRODIURIL TAKE 1 TABLET BY MOUTH EVERY DAY   Klor-Con M10 10 MEQ tablet Generic drug: potassium chloride TAKE 1 TABLET (10 MEQ TOTAL) BY MOUTH 2 (TWO) TIMES DAILY. What changed: See the new instructions.   MULTIPLE VITAMIN PO Take 1 tablet by mouth daily.   ondansetron 8 MG tablet Commonly known as: ZOFRAN Take 1 tablet (8 mg total) by mouth every 8 (eight) hours as needed for nausea or vomiting.   predniSONE 20 MG tablet Commonly known as: DELTASONE Take 20 mg by mouth daily with breakfast.            Durable Medical Equipment  (From admission, onward)         Start     Ordered   03/15/20 0954  For home use only DME Walker rolling  Once       Question Answer Comment  Walker: With [employer] Wheels   Patient needs a walker to treat with the following condition Weakness      03/15/20 0953          Follow-up Information    [facility] Follow up.   Why: rolling walker Contact information: [place] [address] [phone]        [facility] Follow up.   Why: HHPT Contact information: [place] [address] [phone]              No Known Allergies  Consultations:  Palliative medicine   Procedures/Studies: VAS [place] UPPER EXTREMITY VENOUS DUPLEX  Result Date: 03/12/2020 UPPER VENOUS STUDY  Indications: Swelling, and metastatic squamous cell carcinoma with suspected lung primary. Comparison Study: No prior study Performing  Technologist: Maudry Mayhew MHA, RDMS, RVT, RDCS  Examination Guidelines: A complete evaluation includes B-mode imaging, spectral Doppler, color Doppler, and power Doppler as needed of all accessible portions of each vessel. Bilateral testing is considered an integral part of a complete examination. Limited examinations for reoccurring indications may be performed as noted.  Right Findings: +----------+------------+---------+-----------+----------+-------+ RIGHT     CompressiblePhasicitySpontaneousPropertiesSummary +----------+------------+---------+-----------+----------+-------+ Subclavian               Yes       Yes                      +----------+------------+---------+-----------+----------+-------+  Left Findings: +----------+------------+---------+-----------+----------+-------+ LEFT      CompressiblePhasicitySpontaneousPropertiesSummary +----------+------------+---------+-----------+----------+-------+ IJV           None                 No                       +----------+------------+---------+-----------+----------+-------+ Subclavian    None  No                       +----------+------------+---------+-----------+----------+-------+ Axillary      None                 No                       +----------+------------+---------+-----------+----------+-------+ Brachial      None                 No                       +----------+------------+---------+-----------+----------+-------+ Radial        None                 No                       +----------+------------+---------+-----------+----------+-------+ Ulnar         None                 No                       +----------+------------+---------+-----------+----------+-------+ Cephalic      Full                                          +----------+------------+---------+-----------+----------+-------+ Basilic       None                                           +----------+------------+---------+-----------+----------+-------+  Summary:  Right: No evidence of thrombosis in the subclavian.  Left: Findings consistent with acute deep vein thrombosis involving the left internal jugular vein, left subclavian vein, left axillary vein, left brachial veins, left radial veins and left ulnar veins. Findings consistent with acute superficial vein thrombosis involving the left basilic vein.  *See table(s) above for measurements and observations.  Diagnosing physician: Servando Snare MD Electronically signed by Servando Snare MD on 03/12/2020 at 11:25:09 AM.    Final     (Echo, Carotid, EGD, Colonoscopy, ERCP)    Subjective: Patient seen and examined.  No overnight events.  Feels about the same.  She can take her injections herself. Patient was worried about going to her oncology appointment Friday in her car.  Patient and family were wondering whether we can do hospital to hospital transfer, however given no acuity of management, this is not possible. Patient desired to discuss with palliative care.  Patient met with palliative care team and they will continue to follow her outpatient.   Discharge Exam: Vitals:   03/15/20 0402 03/15/20 1151  BP: (!) 151/77 137/76  Pulse: (!) 107 93  Resp:  18  Temp: 98.8 F (37.1 C) 98.3 F (36.8 C)  SpO2: 92% 91%   Vitals:   03/14/20 1242 03/14/20 2150 03/15/20 0402 03/15/20 1151  BP: 136/71 138/71 (!) 151/77 137/76  Pulse: 96 83 (!) 107 93  Resp: 17 16  18   Temp: 97.9 F (36.6 C) 98.2 F (36.8 C) 98.8 F (37.1 C) 98.3 F (36.8 C)  TempSrc:  Oral Oral Oral  SpO2: 93% 93% 92% 91%  Weight:   55.2 kg  Height:        General: Pt is alert, awake, not in acute distress Comfortable at rest.  Chronically sick looking but not in any distress. She has a large neck mass with no apparent pressure effect. Cardiovascular: RRR, S1/S2 +, no rubs, no gallops Respiratory: CTA bilaterally, no wheezing, no rhonchi Abdominal:  Soft, NT, ND, bowel sounds + Extremities: Edematous left upper extremity, no distal neurovascular deficits.    The results of significant diagnostics from this hospitalization (including imaging, microbiology, ancillary and laboratory) are listed below for reference.     Microbiology: Recent Results (from the past 240 hour(s))  Respiratory Panel by RT PCR (Flu A&B, Covid) - Nasopharyngeal Swab     Status: None   Collection Time: 03/11/20 11:36 PM   Specimen: Nasopharyngeal Swab  Result Value Ref Range Status   SARS Coronavirus 2 by RT PCR NEGATIVE NEGATIVE Final    Comment: (NOTE) SARS-CoV-2 target nucleic acids are NOT DETECTED.  The SARS-CoV-2 RNA is generally detectable in upper respiratoy specimens during the acute phase of infection. The lowest concentration of SARS-CoV-2 viral copies this assay can detect is 131 copies/mL. A negative result does not preclude SARS-Cov-2 infection and should not be used as the sole basis for treatment or other patient management decisions. A negative result may occur with  improper specimen collection/handling, submission of specimen other than nasopharyngeal swab, presence of viral mutation(s) within the areas targeted by this assay, and inadequate number of viral copies (<131 copies/mL). A negative result must be combined with clinical observations, patient history, and epidemiological information. The expected result is Negative.  Fact Sheet for Patients:  PinkCheek.be  Fact Sheet for Healthcare Providers:  GravelBags.it  This test is no t yet approved or cleared by the [place] FDA and  has been authorized for detection and/or diagnosis of SARS-CoV-2 by FDA under an Emergency Use Authorization (EUA). This EUA will remain  in effect (meaning this test can be used) for the duration of the COVID-19 declaration under Section 564(b)(1) of the Act, 21 U.S.C. section  360bbb-3(b)(1), unless the authorization is terminated or revoked sooner.     Influenza A by PCR NEGATIVE NEGATIVE Final   Influenza B by PCR NEGATIVE NEGATIVE Final    Comment: (NOTE) The Xpert Xpress SARS-CoV-2/FLU/RSV assay is intended as an aid in  the diagnosis of influenza from Nasopharyngeal swab specimens and  should not be used as a sole basis for treatment. Nasal washings and  aspirates are unacceptable for Xpert Xpress SARS-CoV-2/FLU/RSV  testing.  Fact Sheet for Patients: PinkCheek.be  Fact Sheet for Healthcare Providers: GravelBags.it  This test is not yet approved or cleared by the [place] FDA and  has been authorized for detection and/or diagnosis of SARS-CoV-2 by  FDA under an Emergency Use Authorization (EUA). This EUA will remain  in effect (meaning this test can be used) for the duration of the  Covid-19 declaration under Section 564(b)(1) of the Act, 21  U.S.C. section 360bbb-3(b)(1), unless the authorization is  terminated or revoked. Performed at [facility], [place] [address], [place], [place] [address]      Labs: BNP (last 3 results) No results for input(s): BNP in the last 8760 hours. Basic Metabolic Panel: Recent Labs  Lab 03/11/20 1830 03/12/20 0720 03/13/20 0701 03/14/20 0516  NA 137 138 138 137  K 3.6 3.0* 3.4* 3.5  CL 95* 98 101 103  CO2 27 29 26 24   GLUCOSE 163* 119* 138* 138*  BUN  19 16 14 10   CREATININE 0.85 0.78 0.75 0.70  CALCIUM 8.6* 7.6* 7.6* 7.5*  MG  --  2.0 1.8 1.8   Liver Function Tests: Recent Labs  Lab 03/11/20 1830  AST 18  ALT 12  ALKPHOS 110  BILITOT 0.4  PROT 5.4*  ALBUMIN 2.0*   No results for input(s): LIPASE, AMYLASE in the last 168 hours. No results for input(s): AMMONIA in the last 168 hours. CBC: Recent Labs  Lab 03/11/20 2046 03/12/20 0720 03/13/20 0701 03/14/20 0516 03/15/20 0609  WBC 54.6* 40.9* 40.5* 48.3* 43.2*  NEUTROABS   --  34.8*  --  44.4*  --   HGB 8.2* 7.5* 7.4* 7.5* 7.4*  HCT 26.7* 24.0* 24.2* 23.9* 23.4*  MCV 94.7 96.4 96.8 96.8 95.9  PLT 291 271 256 280 267   Cardiac Enzymes: No results for input(s): CKTOTAL, CKMB, CKMBINDEX, TROPONINI in the last 168 hours. BNP: Invalid input(s): POCBNP CBG: No results for input(s): GLUCAP in the last 168 hours. D-Dimer No results for input(s): DDIMER in the last 72 hours. Hgb A1c No results for input(s): HGBA1C in the last 72 hours. Lipid Profile No results for input(s): CHOL, HDL, LDLCALC, TRIG, CHOLHDL, LDLDIRECT in the last 72 hours. Thyroid function studies No results for input(s): TSH, T4TOTAL, T3FREE, THYROIDAB in the last 72 hours.  Invalid input(s): FREET3 Anemia work up No results for input(s): VITAMINB12, FOLATE, FERRITIN, TIBC, IRON, RETICCTPCT in the last 72 hours. Urinalysis    Component Value Date/Time   COLORURINE YELLOW 07/15/2019 1543   APPEARANCEUR Cloudy (A) 07/15/2019 1543   LABSPEC >=1.030 (A) 07/15/2019 1543   PHURINE 5.5 07/15/2019 1543   GLUCOSEU NEGATIVE 07/15/2019 1543   HGBUR MODERATE (A) 07/15/2019 1543   BILIRUBINUR NEGATIVE 07/15/2019 1543   BILIRUBINUR negative 10/25/2011 1605   KETONESUR TRACE (A) 07/15/2019 1543   PROTEINUR negative 10/25/2011 1605   UROBILINOGEN 0.2 07/15/2019 1543   NITRITE NEGATIVE 07/15/2019 1543   LEUKOCYTESUR TRACE (A) 07/15/2019 1543   Sepsis Labs Invalid input(s): PROCALCITONIN,  WBC,  LACTICIDVEN Microbiology Recent Results (from the past 240 hour(s))  Respiratory Panel by RT PCR (Flu A&B, Covid) - Nasopharyngeal Swab     Status: None   Collection Time: 03/11/20 11:36 PM   Specimen: Nasopharyngeal Swab  Result Value Ref Range Status   SARS Coronavirus 2 by RT PCR NEGATIVE NEGATIVE Final    Comment: (NOTE) SARS-CoV-2 target nucleic acids are NOT DETECTED.  The SARS-CoV-2 RNA is generally detectable in upper respiratoy specimens during the acute phase of infection. The  lowest concentration of SARS-CoV-2 viral copies this assay can detect is 131 copies/mL. A negative result does not preclude SARS-Cov-2 infection and should not be used as the sole basis for treatment or other patient management decisions. A negative result may occur with  improper specimen collection/handling, submission of specimen other than nasopharyngeal swab, presence of viral mutation(s) within the areas targeted by this assay, and inadequate number of viral copies (<131 copies/mL). A negative result must be combined with clinical observations, patient history, and epidemiological information. The expected result is Negative.  Fact Sheet for Patients:  PinkCheek.be  Fact Sheet for Healthcare Providers:  GravelBags.it  This test is no t yet approved or cleared by the [place] FDA and  has been authorized for detection and/or diagnosis of SARS-CoV-2 by FDA under an Emergency Use Authorization (EUA). This EUA will remain  in effect (meaning this test can be used) for the duration of the COVID-19 declaration under Section  564(b)(1) of the Act, 21 U.S.C. section 360bbb-3(b)(1), unless the authorization is terminated or revoked sooner.     Influenza A by PCR NEGATIVE NEGATIVE Final   Influenza B by PCR NEGATIVE NEGATIVE Final    Comment: (NOTE) The Xpert Xpress SARS-CoV-2/FLU/RSV assay is intended as an aid in  the diagnosis of influenza from Nasopharyngeal swab specimens and  should not be used as a sole basis for treatment. Nasal washings and  aspirates are unacceptable for Xpert Xpress SARS-CoV-2/FLU/RSV  testing.  Fact Sheet for Patients: PinkCheek.be  Fact Sheet for Healthcare Providers: GravelBags.it  This test is not yet approved or cleared by the [place] FDA and  has been authorized for detection and/or diagnosis of SARS-CoV-2 by  FDA under  an Emergency Use Authorization (EUA). This EUA will remain  in effect (meaning this test can be used) for the duration of the  Covid-19 declaration under Section 564(b)(1) of the Act, 21  U.S.C. section 360bbb-3(b)(1), unless the authorization is  terminated or revoked. Performed at [facility], [place] [address], [place], [place] [address]      Time coordinating discharge: 40 minutes  SIGNED:   Barb Merino, MD  [facility] 03/15/2020, 12:43 PM

## 2020-03-15 NOTE — Progress Notes (Signed)
Attempted to go over D/C instructions with pt but pt stated she wasn't comfortable going home with lovenox injections. After discussing this with her and her primary RN, she was having difficulty releasing the needle safety cover and had only given 1 injection and had been given verbal instructions once. She would like to have another injection and TOC team would like to include her son with teaching as well. MD informed and agreed to cancel D/C at this time and moving injection time to 8pm. Pt agreeable to all except refused to have son involvement

## 2020-03-15 NOTE — Progress Notes (Signed)
[facility] ([facility])  Hospital Liaison: RN note         Notified by TOC manager of patient/family request for [facility] Palliative services at home after discharge.                  [facility] Palliative team will follow up with patient after discharge.         Please call with any hospice or palliative related questions.         Thank you for this referral.         Mary Anne Robertson, RN, CCM  [facility] Hospital Liaison (listed on [facility] under [facility]osp[facility])    [phone]   

## 2020-03-15 NOTE — Progress Notes (Signed)
After multiple attempts, RN in to complete Lovenox injection education with patient. Lovenox kit provided by pharmacy. RN went through education booklet provided with Lovenox kit and step by step process in how to administer Lovenox. Patient provided herself with dose of Lovenox. RN answered patient questions and patient was able to teach back how to administer Lovenox. Patient had some trouble initiating the safety feature on the needle. RN informed her to place needles in sharps container provided. After injection and education, patient states that this was her first hands on experience and would prefer to complete more injections on herself prior to discharge. Patient would like to be discharged tomorrow and not today.   RN informed MD of this information. MD stated that he would not take the DC order out and to continue education with patient.   Discharge RN Raquel Sarna, Case Manager, and SW notified of conversation and suggested that RN can attempt to educate patient's son as well.   Raquel Sarna, RN spoke with MD and discharge has now been canceled.   RN informed patient of plan. Patient refused for son to be educated stating that "he will not have anything to do with the injections". Patient states that she feels comfortable with completing one more injection and would like to discharge tomorrow.   Discharge RN Raquel Sarna, Case Manager, and SW notified.

## 2020-03-16 ENCOUNTER — Inpatient Hospital Stay (HOSPITAL_COMMUNITY): Payer: BC Managed Care – PPO

## 2020-03-16 DIAGNOSIS — C7989 Secondary malignant neoplasm of other specified sites: Principal | ICD-10-CM

## 2020-03-16 NOTE — Progress Notes (Signed)
D/C instructions given and reviewed. Questions answered and encouraged to call with any further concerns. Tele and IV removed. Tolerated well.

## 2020-03-16 NOTE — Progress Notes (Signed)
Physical Therapy Treatment Patient Details Name: Jillian Hartman MRN: [id number] DOB: 06-Oct-1950 Today's Date: 03/16/2020    History of Present Illness The pt is a 69 yo female presenting with decreased appetite and weakness following start of a new chemotherapy regiment a few days ago. She is undergoing continued workup for weakness and LUE swelling. PMH includes: metastatic squamous cell carcinoma with suspected lung primary, CLL, and HTN.     PT Comments    Pt is getting ready to go home today, she was just able to give herself her Lovenox shot this morning without assist. Pt does not want to leave her room but is concerned about climbing stairs to get into her home. Pt is limited in her safe mobility by decreased strength and endurance. Pt is currently mod I for bed mobility, min guard for transfers, supervision for short distance ambulation and minA for navigating steps sideways with use of the railing. D/c plans are appropriate. Pt if hopeful to d/c this morning.    Follow Up Recommendations  Home health PT;Supervision for mobility/OOB     Equipment Recommendations  Rolling walker with 5" wheels (tub bench)       Precautions / Restrictions Precautions Precautions: Fall Restrictions Weight Bearing Restrictions: No    Mobility  Bed Mobility Overal bed mobility: Modified Independent             General bed mobility comments: increased time and some use of bed rails, but no assist needed  Transfers Overall transfer level: Needs assistance Equipment used: Rolling walker (2 wheeled);1 person hand held assist Transfers: Sit to/from Stand Sit to Stand: Min guard         General transfer comment: min guard for power up and steadying at [employer]  Ambulation/Gait Ambulation/Gait assistance: Supervision Gait Distance (Feet): 3 Feet Assistive device: Rolling walker (2 wheeled) Gait Pattern/deviations: Step-through pattern;Decreased stride length Gait velocity: decreased    General Gait Details: able to use RW to walk to recliner and back to bed to simulate stair climb and back to recliner with supervision for safety    Stairs Stairs: Yes Stairs assistance: Min assist Stair Management: One rail Left;Sideways;Step to pattern Number of Stairs: 1 (1 step 4 times) General stair comments: pt educated on using one rail with both hands sideways for optimal support, PT placed hands on hips for stabilizing, pt reports feeling much better about getting into her house after education        Balance Overall balance assessment: Mild deficits observed, not formally tested                                          Cognition Arousal/Alertness: Awake/alert Behavior During Therapy: WFL for tasks assessed/performed Overall Cognitive Status: Within Functional Limits for tasks assessed                                           General Comments General comments (skin integrity, edema, etc.): L elbow edema still present, pt reports no associated pain      Pertinent Vitals/Pain Pain Assessment: No/denies pain           PT Goals (current goals can now be found in the care plan section) Acute Rehab PT Goals Patient Stated Goal: improve strength PT Goal Formulation: With patient  Time For Goal Achievement: 03/26/20 Potential to Achieve Goals: Fair Progress towards PT goals: Progressing toward goals    Frequency    Min 3X/week      PT Plan Current plan remains appropriate (will likely refuse HHPT)       AM-PAC PT "6 Clicks" Mobility   Outcome Measure  Help needed turning from your back to your side while in a flat bed without using bedrails?: None Help needed moving from lying on your back to sitting on the side of a flat bed without using bedrails?: None Help needed moving to and from a bed to a chair (including a wheelchair)?: None Help needed standing up from a chair using your arms (e.g., wheelchair or bedside  chair)?: None Help needed to walk in hospital room?: A Little Help needed climbing 3-5 steps with a railing? : A Lot 6 Click Score: 21    End of Session Equipment Utilized During Treatment: Gait belt Activity Tolerance: Patient tolerated treatment well Patient left: in chair;with call bell/phone within reach Nurse Communication: Mobility status PT Visit Diagnosis: Unsteadiness on feet (R26.81);Muscle weakness (generalized) (M62.81);Difficulty in walking, not elsewhere classified (R26.2)     Time: 0930-1002 PT Time Calculation (min) (ACUTE ONLY): 32 min  Charges:  $Gait Training: 23-37 mins                     Mayari Matus B. Migdalia Dk PT, DPT Acute Rehabilitation Services Pager [phone] Office [phone]    [employer] 03/16/2020, 12:32 PM

## 2020-03-16 NOTE — Plan of Care (Signed)

## 2020-03-16 NOTE — Progress Notes (Signed)
Daily Progress Note   Patient Name: Jillian Hartman       Date: 03/16/2020 DOB: Jul 09, 1950  Age: 69 y.o. MRN#: [id number] Attending Physician: Barb Merino, MD Primary Care Physician: Cassandria Anger, MD Admit Date: 03/11/2020  Reason for Consultation/Follow-up: Establishing goals of care  Subjective/GOC: Patient awake, alert, oriented. In good spirits this morning. Able to successfully give herself two Lovenox injections and feels comfortable doing so. Plan is for discharge home today.    Patient appreciative of discussions yesterday and plans to continue discussions with her son. They have a tele visit with oncology, [name] tomorrow 10/14 at 1pm. Again confirmed that outpatient palliative staff should be calling within one week to further discuss plan/goals once she is home.   Length of Stay: 4  Current Medications: Scheduled Meds:  . enoxaparin (LOVENOX) injection  50 mg Subcutaneous Q12H  . predniSONE  20 mg Oral Q breakfast  . senna-docusate  1 tablet Oral BID    Continuous Infusions:   PRN Meds: acetaminophen **OR** acetaminophen, ondansetron **OR** ondansetron (ZOFRAN) IV, polyethylene glycol  Physical Exam Vitals and nursing note reviewed.  Constitutional:      General: She is awake.  HENT:     Head: Normocephalic and atraumatic.  Pulmonary:     Effort: No tachypnea, accessory muscle usage or respiratory distress.  Neurological:     Mental Status: She is alert and oriented to person, place, and time.  Psychiatric:        Mood and Affect: Mood normal.        Speech: Speech normal.        Behavior: Behavior normal.        Cognition and Memory: Cognition normal.             Vital Signs: BP (!) 149/73 (BP Location: Right Arm)   Pulse (!) 108    Temp 99.3 F (37.4 C) (Oral)   Resp 17   Ht 5\' 4"  (1.626 m)   Wt 54.7 kg   SpO2 91%   BMI 20.70 kg/m  SpO2: SpO2: 91 % O2 Device: O2 Device: Room Air O2 Flow Rate:    Intake/output summary:   Intake/Output Summary (Last 24 hours) at 03/16/2020 0932 Last data filed at 03/15/2020 2209 Gross per 24 hour  Intake 240 ml  Output --  Net 240 ml   LBM: Last BM Date: 03/15/20 Baseline Weight: Weight: 45.8 kg Most recent weight: Weight: 54.7 kg       Palliative Assessment/Data: PPS 50%    Flowsheet Rows     Most Recent Value  Intake Tab  Referral Department Hospitalist  Unit at Time of Referral Cardiac/Telemetry Unit  Palliative Care Primary Diagnosis Cancer  Palliative Care Type New Palliative care  Reason for referral Clarify Goals of Care  Date first seen by Palliative Care 03/15/20  Clinical Assessment  Palliative Performance Scale Score 50%  Psychosocial & Spiritual Assessment  Palliative Care Outcomes  Patient/Family meeting held? Yes  Who was at the meeting? patient  Palliative Care Outcomes Clarified goals of care, Counseled regarding hospice, Provided end of life care assistance, Provided psychosocial or spiritual support, ACP counseling assistance, Linked to palliative care logitudinal support      Patient Active Problem List   Diagnosis Date Noted  . Small cell carcinoma ([place])   . Palliative care by specialist   . CLL (chronic lymphocytic leukemia) ([place]) 03/12/2020  . FTT (failure to thrive) in adult 03/12/2020  . General weakness 03/11/2020  . Encounter for antineoplastic chemotherapy 10/27/2019  . Normocytic anemia 09/16/2019  . Leukocytosis 09/16/2019  . Goals of care, counseling/discussion 09/16/2019  . 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 Neck swelling 07/15/2019  . Infected tooth 07/15/2019  . Cough 07/15/2019  . Weight loss 07/15/2019  . Contusion of multiple sites of right shoulder  04/25/2016  . Well adult exam 02/19/2015  . Hypokalemia 02/19/2015  . Edema 02/16/2015  . Trigeminy 02/16/2015  . Hematuria 10/25/2011  . Routine health maintenance 04/15/2011  . Essential hypertension 05/14/2007  . VARICOSE VEIN 05/14/2007    Palliative Care Assessment & Plan   Patient Profile: 69 y.o. female  with past medical history of metastatic squamous cell carcinoma with suspected lung primary, CLL, hypertension admitted on 03/11/2020 with weakness/fatigue and left arm swelling. Followed by [facility] receiving oral chemotherapy and s/p first dose of carboplatin, paclitaxel, and pembrolizumab approximately 2 weeks ago. Discomfort from large neck mass. Found to have extensive acute left upper extremity DVT on heparin gtt now transitioned to lovenox injections. Palliative medicine consultation for goals of care.  Assessment: Metastatic squamous cell carcinoma  CLL Acute LUE DVT Leukocytosis Adult failure to thrive  Recommendations/Plan:  Patient very reluctant to receive IV chemo again on Friday. She would be willing to continue oral chemotherapy if recommended by her oncologist. She is greatly considering quality of life/comfort approach to care moving forward. Scheduled tele visit with oncologist 10/14 at [place] is home with home health. Patient agreeable with outpatient palliative f/u understanding this can transition to hospice services in the near future.   Introduced MOST form on 10/12. Patient considering DNR/DNI code status but plans to further discuss with her son before making any changes.   She has a documented living will with son, [name] as HCPOA.   Code Status: FULL   Code Status Orders  (From admission, onward)         Start     Ordered   03/12/20 0119  Full code  Continuous        03/12/20 0118        Code Status History    This patient has a current code status but no historical code status.   Advance Care Planning Activity  Prognosis:  Poor long-term  Discharge Planning:  Home with Home Health and outpatient palliative f/u.   Care plan was discussed with patient  Thank you for allowing the Palliative Medicine Team to assist in the care of this patient.   Total Time 15 Prolonged Time Billed no      Greater than 50%  of this time was spent counseling and coordinating care related to the above assessment and plan.  Ihor Dow, DNP, FNP-C Palliative Medicine Team  Phone: [phone] Fax: [phone]  Please contact Palliative Medicine Team phone at [phone] for questions and concerns.

## 2020-03-16 NOTE — Progress Notes (Signed)
Patient seen and examined.  She did not go home yesterday as she was not comfortable enough to take Lovenox injections.  Overnight events noted.  She was noted to have swelling of her right elbow and duplexes were ordered.  Today morning patient feels good.  She has been very well informed about her cancer process and she is scheduled to have a video conference with her oncologist tomorrow at home to discuss different options, she does not want to continue chemotherapy.  Now comfortable taking Lovenox injections.  Plan: Detailed discharge summary from 10/12, reviewed and no changes needed. Communication with her oncologist on 10/12, they have follow-up scheduled for 10/14 and 10/15. Referral to community palliative care. Discharge home today.  Physical Exam Constitutional:      Appearance: Normal appearance.     Comments: Looks comfortable on room air.  HENT:     Head: Atraumatic.  Eyes:     Pupils: Pupils are equal, round, and reactive to light.  Neck:     Comments: Patient has large mass on the anterior neck, boggy swelling on the left neck. Cardiovascular:     Rate and Rhythm: Regular rhythm.  Musculoskeletal:     Comments: Left arm and forearm with boggy swelling and edema, distal neurovascular status intact. Right elbow with pitting edema and swelling, distal neurovascular status intact.  No redness or erythema.  Neurological:     General: No focal deficit present.     Mental Status: She is alert and oriented to person, place, and time.  Psychiatric:        Mood and Affect: Mood normal.   Total time spent: 20 minutes.

## 2020-03-17 ENCOUNTER — Telehealth: Payer: Self-pay | Admitting: Internal Medicine

## 2020-03-17 DIAGNOSIS — C3490 Malignant neoplasm of unspecified part of unspecified bronchus or lung: Secondary | ICD-10-CM | POA: Diagnosis not present

## 2020-03-17 NOTE — Telephone Encounter (Signed)
Patient was discharged from hospital yesterday and requesting palliative care orders  Jillian Hartman from [facility] Phone# [phone] option 2

## 2020-03-18 NOTE — Telephone Encounter (Signed)
Per chart Hosp f/u scheduled for 03/31/20.Marland KitchenJohny Hartman

## 2020-03-22 ENCOUNTER — Telehealth: Payer: Self-pay | Admitting: Adult Health Nurse Practitioner

## 2020-03-22 NOTE — Telephone Encounter (Signed)
Called Den[facility] no answer LMOM w/MD response.Marland KitchenJohny Chess

## 2020-03-22 NOTE — Telephone Encounter (Signed)
Called patient's son, [name] Cadena, to schedule the Palliative Consult, no answer - left message with reason for call along with my name and call back number.

## 2020-03-22 NOTE — Telephone Encounter (Signed)
OK w/me Thx 

## 2020-03-22 NOTE — Telephone Encounter (Signed)
Rec'd return call from patient's son, [name] Jaycox, and after discussing Palliative services and all questions were answered he was in agreement with scheduling visit with NP.  I have scheduled an In-person Consult for 03/24/20 @ 8;30 AM.

## 2020-03-23 ENCOUNTER — Telehealth: Payer: Self-pay | Admitting: Adult Health Nurse Practitioner

## 2020-03-23 NOTE — Telephone Encounter (Signed)
Rec'd call from son [name], wanting to reschedule the Palliative Consult, this was rescheduled for 03/29/20 @ 9 AM.  Notified NP and Palliative Team.

## 2020-03-24 ENCOUNTER — Other Ambulatory Visit: Payer: Self-pay | Admitting: Adult Health Nurse Practitioner

## 2020-03-28 ENCOUNTER — Telehealth: Payer: Self-pay | Admitting: Adult Health Nurse Practitioner

## 2020-03-29 ENCOUNTER — Other Ambulatory Visit: Payer: Self-pay | Admitting: Adult Health Nurse Practitioner

## 2020-03-31 ENCOUNTER — Inpatient Hospital Stay: Payer: BC Managed Care – PPO | Admitting: Internal Medicine

## 2020-03-31 ENCOUNTER — Telehealth: Payer: Self-pay | Admitting: Internal Medicine

## 2020-03-31 NOTE — Telephone Encounter (Signed)
[facility] [employer] [facility] dropped off death certificate and it was signed by MD and they have been contacted to pick up.

## 2020-04-04 NOTE — Telephone Encounter (Signed)
Rec'd voicemail from patient's son, [name], stating that the patient had passed away this morning.  Will notify MD and Palliative Team.

## 2020-04-04 DEATH — deceased

## 2020-04-24 ENCOUNTER — Other Ambulatory Visit: Payer: Self-pay | Admitting: Internal Medicine

## 2020-08-26 ENCOUNTER — Other Ambulatory Visit (HOSPITAL_COMMUNITY): Payer: Self-pay

## 2021-12-30 IMAGING — CT CT CHEST W/ CM
2 of 5 series · 12 of 36 positions shown, 15 images · IV contrast (OMNIPAQUE)
Comparison: 09/07/2019 PET.  Chest CT 07/17/2019.

CLINICAL DATA: Non-small-cell lung cancer diagnosed in [REDACTED].
Hysterectomy. Oral chemotherapy ongoing. 15 pound weight loss over 1
year. Cervical node biopsy 08/19/2019 demonstrating squamous cell
carcinoma.

EXAM:
CT CHEST, ABDOMEN, AND PELVIS WITH CONTRAST
TECHNIQUE: Multidetector CT imaging of the chest, abdomen and pelvis was
performed following the standard protocol during bolus
administration of intravenous contrast.
CONTRAST:  100mL OMNIPAQUE IOHEXOL 300 MG/ML  SOLN

[Series 2: cap with · axial · 0.75mm/px · z∈[-282,+198]mm · 9 of 120 slices shown, 12 images]
[im 12/120  mediastinal]
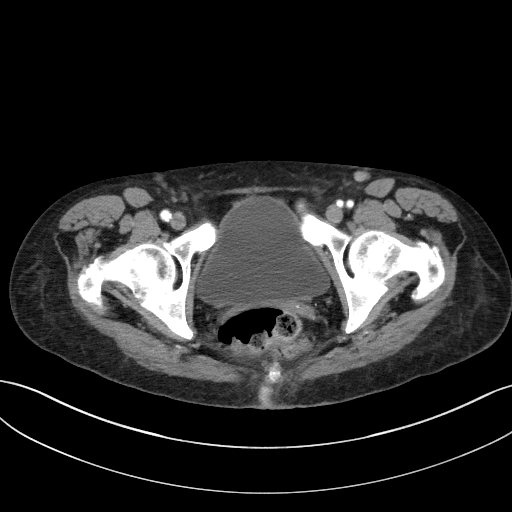
[im 12/120  lung]
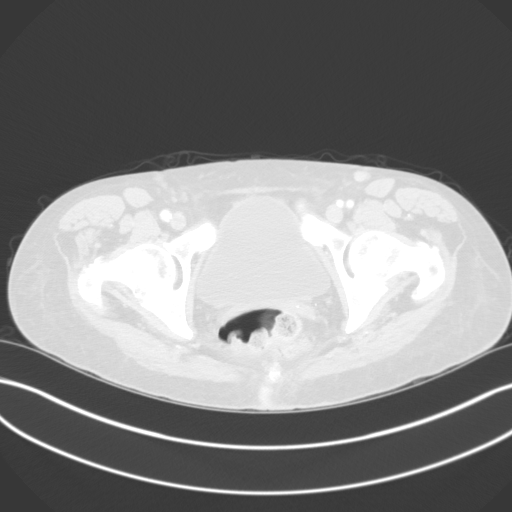
[im 24/120  lung]
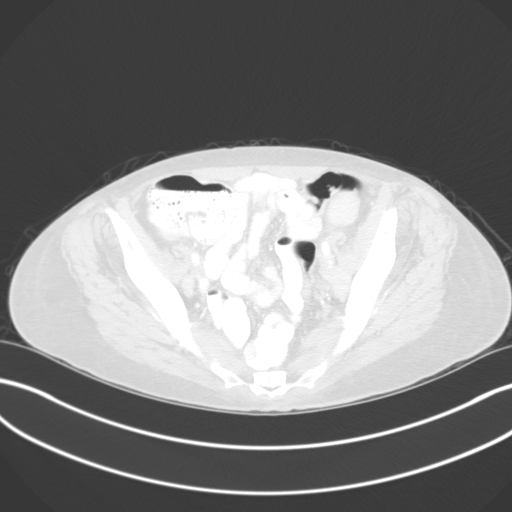
[im 36/120  lung]
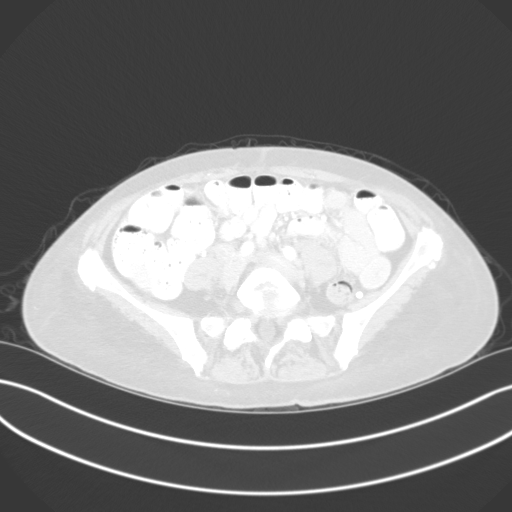
[im 48/120  lung]
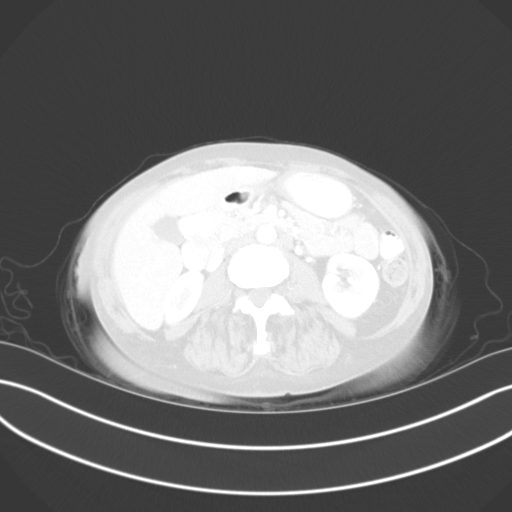
[im 60/120  mediastinal]
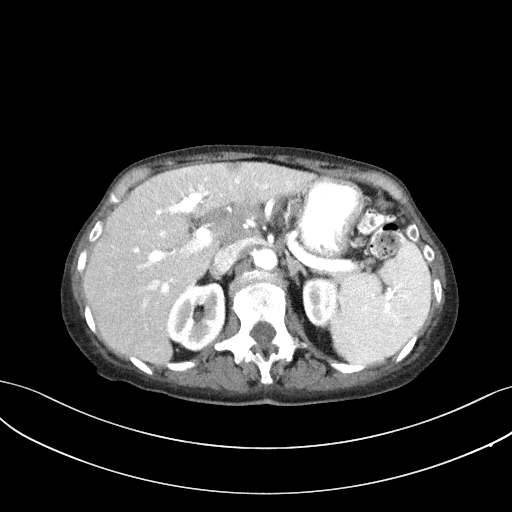
[im 60/120  lung]
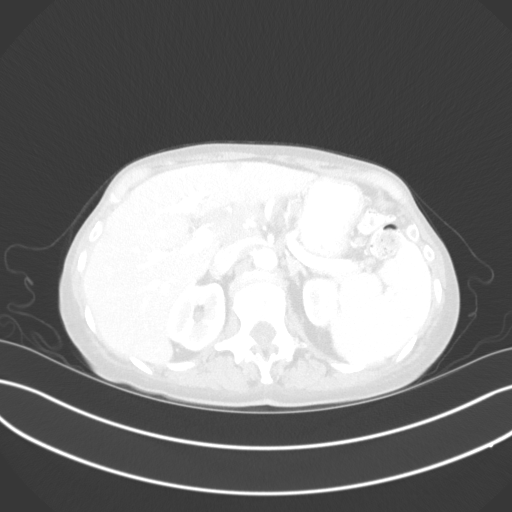
[im 72/120  lung]
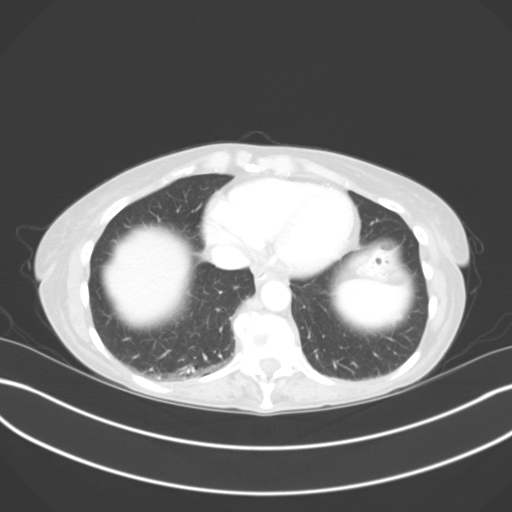
[im 84/120  lung]
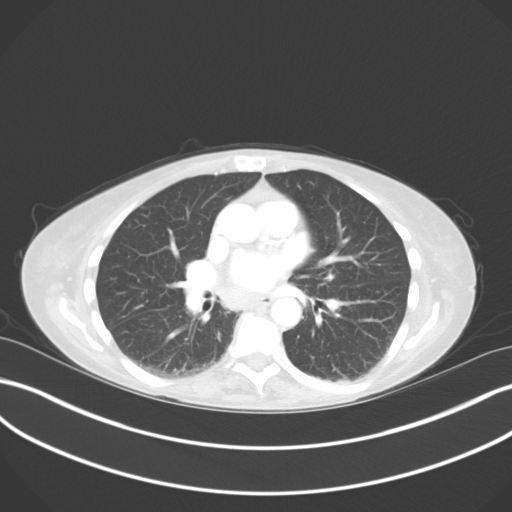
[im 96/120  lung]
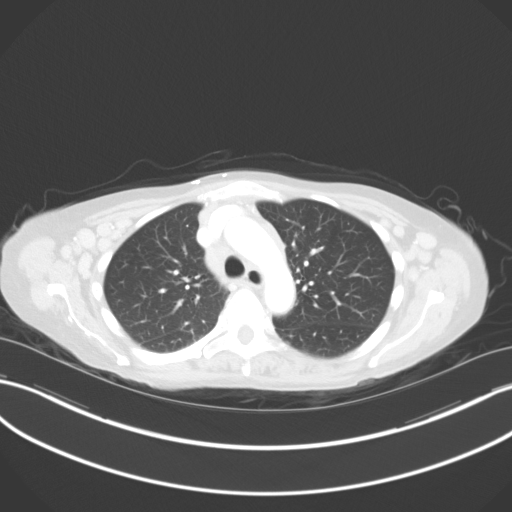
[im 108/120  mediastinal]
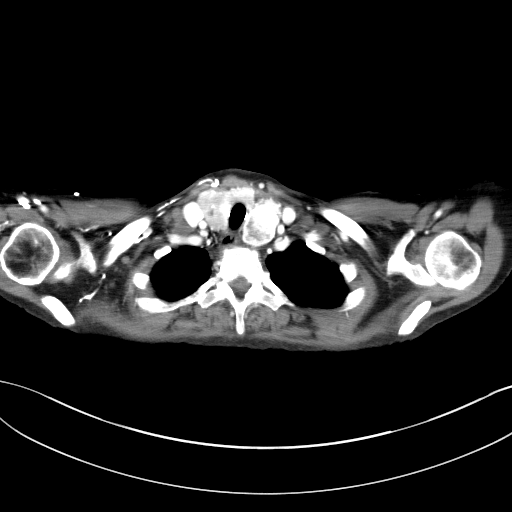
[im 108/120  lung]
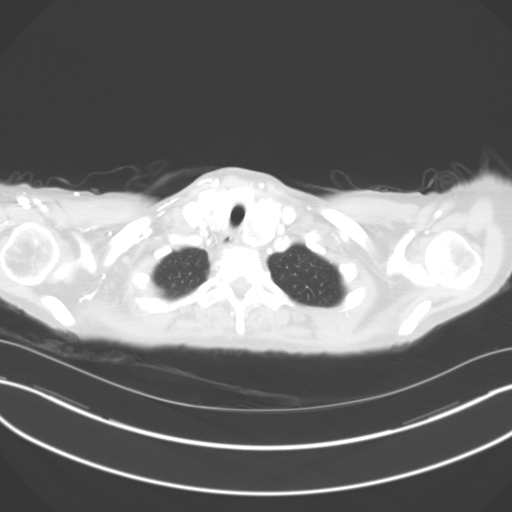

[Series 4: coronals · coronal · 0.82mm/px · 3 of 113 slices shown]
[im 23/113  lung]
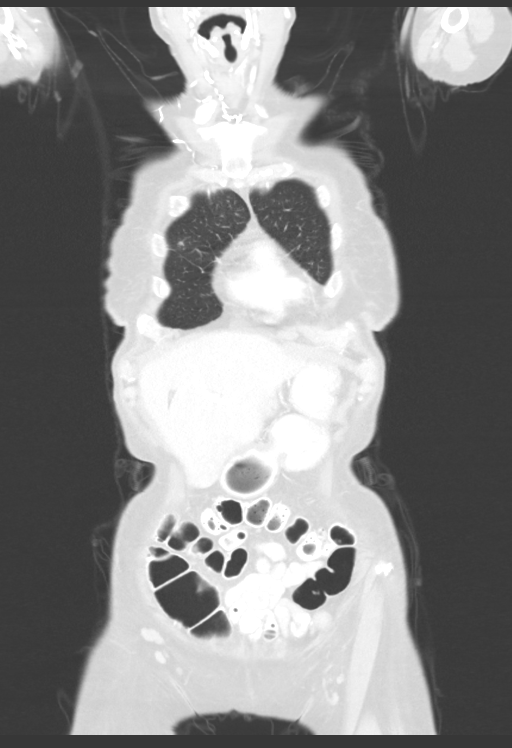
[im 45/113  lung]
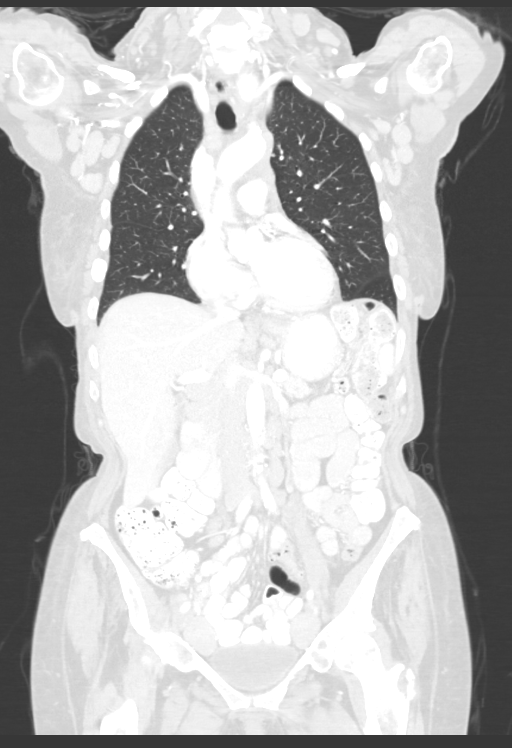
[im 68/113  lung]
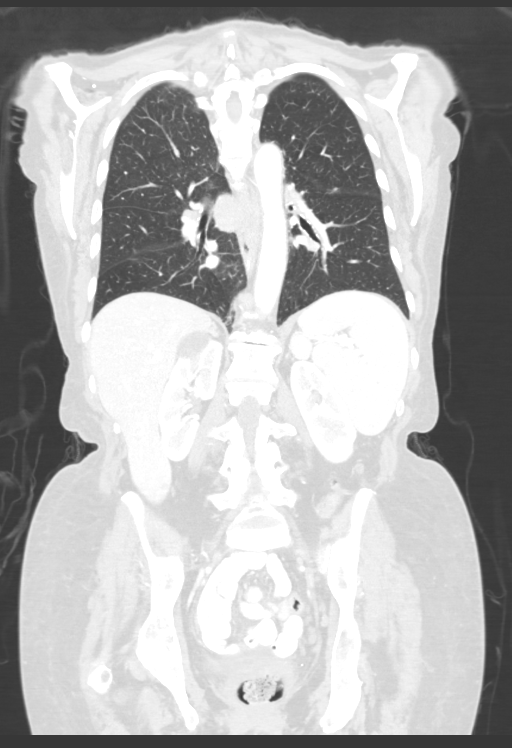

[12 of 36 positions shown; findings below may reference images not displayed]

FINDINGS: CT CHEST FINDINGS

Cardiovascular: Aortic atherosclerosis. Normal heart size, without
pericardial effusion. Multivessel coronary artery atherosclerosis.
No central pulmonary embolism, on this non-dedicated study.

Mediastinum/Nodes: Thyroid enlargement and heterogeneity is chronic
and nonspecific.

A necrotic low jugular/supraclavicular node measures 2.1 cm on [DATE]
versus 1.8 cm on the prior PET (when remeasured).

Index right axillary node measures 1.2 cm on [DATE] versus 1.0 cm on
the prior exam (when remeasured).

Subcarinal nodal mass measures 1.9 x 3.3 cm on 34/2 versus 2.1 x
cm on the prior PET

Lungs/Pleura: No pleural fluid. Macrolobulated right middle lobe
pulmonary nodule measures 1.4 x 1.0 cm on 99/6 versus 1.5 x 0.9 cm
on the 09/07/2019 PET (when remeasured).

A superior segment right lower lobe 4 mm nodule on 67/6 has enlarged
from 2 mm on the prior PET.

Significant improvement to resolution of innumerable smaller
pulmonary nodules on the 09/07/2019 PET.

Musculoskeletal: New sclerotic lesions at the site of lytic
metastasis on the prior PET. Example within the sternal manubrium at
7 mm on [DATE].

T11 moderate compression deformity is new since the prior diagnostic
chest CT, pathologic.

CT ABDOMEN PELVIS FINDINGS

Hepatobiliary: Hypoattenuating segment 2 1.2 cm lesion on 56/2 is
decreased (given comparison and noncontrast CT from PET) compared to
2.0 cm on 09/07/2019 (when remeasured).

Anterior segment 3 lesion measures 1.0 cm today versus 2.0 cm on the
prior PET. Normal gallbladder, without biliary ductal dilatation.

Pancreas: Cystic lesion within the pancreatic head versus an area of
side branch duct ectasia, 7 mm on 68/2. Of doubtful clinical
significance, given comorbidities.

Spleen: Normal in size, without focal abnormality.

Adrenals/Urinary Tract: Normal adrenal glands. Probable splenule
positioned between the left adrenal gland and spleen including on
57/2.

Upper pole right renal 3.2 cm cyst. Normal left kidney and urinary
bladder.

Stomach/Bowel: Normal stomach, without wall thickening. Normal colon
and terminal ileum. Normal small bowel.

Vascular/Lymphatic: Aortic atherosclerosis. Abdominal
retroperitoneal adenopathy. Left periaortic node measures 1.2 cm on
67/2 versus 1.5 cm on the prior PET (when remeasured).

Marked pelvic sidewall adenopathy, with a right external iliac index
node measuring 2.6 cm on 105/2 versus 2.3 cm on the prior.

A left external iliac node measures 2.2 cm on 104/2 versus 1.9 cm on
the prior PET (when remeasured).

Reproductive: Hysterectomy.  No adnexal mass.

Other: No significant free fluid.  Mild pelvic floor laxity.

Musculoskeletal: New sclerosis in the left ischial tuberosity, at
the site of a lytic lesion on the prior PET. Example 116/2,
measuring 1.1 cm. Similar findings within the left pubic bone,
including on 114/2.

New L5 lytic lesion on 91/5, without vertebral body height loss.
IMPRESSION: 1. Primarily response to therapy since the PET of 09/07/2019,
including decreased lung lesions, thoracoabdominal nodes, and liver
lesions.
2. Progressive adenopathy within the pelvis, suggesting mixed
response to therapy.
3. Overall, the appearance of the nodes is more typical of lymphoma
than metastatic squamous cell carcinoma. However, a neck node was
biopsied and positive for squamous cell carcinoma 08/19/2019.
Therefore, this is presumably an atypical appearance of widespread
metastatic squamous cell carcinoma.
4. Primarily healing of osseous metastasis, as evidenced by new
areas of sclerosis. A new T11 compression deformity is pathologic,
without ventral canal encroachment.
5. Coronary artery atherosclerosis. Aortic Atherosclerosis
([id number].J).

## 2022-08-21 ENCOUNTER — Encounter: Payer: Self-pay | Admitting: Internal Medicine
